# Patient Record
Sex: Female | Born: 1950
Health system: Southern US, Community
[De-identification: ages and names within clinical notes are randomized; demographics above are authoritative.]

## PROBLEM LIST (undated history)

## (undated) DIAGNOSIS — N390 Urinary tract infection, site not specified: Secondary | ICD-10-CM

## (undated) DIAGNOSIS — J3489 Other specified disorders of nose and nasal sinuses: Secondary | ICD-10-CM

## (undated) DIAGNOSIS — Z87898 Personal history of other specified conditions: Secondary | ICD-10-CM

## (undated) DIAGNOSIS — Z9109 Other allergy status, other than to drugs and biological substances: Secondary | ICD-10-CM

## (undated) DIAGNOSIS — Z8619 Personal history of other infectious and parasitic diseases: Secondary | ICD-10-CM

## (undated) DIAGNOSIS — U071 COVID-19: Secondary | ICD-10-CM

## (undated) DIAGNOSIS — D259 Leiomyoma of uterus, unspecified: Secondary | ICD-10-CM

## (undated) HISTORY — DX: COVID-19: U07.1

## (undated) HISTORY — DX: Personal history of other infectious and parasitic diseases: Z86.19

## (undated) HISTORY — DX: Other specified disorders of nose and nasal sinuses: J34.89

## (undated) HISTORY — DX: Personal history of other specified conditions: Z87.898

## (undated) HISTORY — DX: Leiomyoma of uterus, unspecified: D25.9

## (undated) HISTORY — DX: Other allergy status, other than to drugs and biological substances: Z91.09

## (undated) HISTORY — DX: Urinary tract infection, site not specified: N39.0

## (undated) HISTORY — PX: OTHER SURGICAL HISTORY: SHX169

---

## 2004-07-22 ENCOUNTER — Ambulatory Visit: Payer: Self-pay | Admitting: Internal Medicine

## 2005-11-22 ENCOUNTER — Ambulatory Visit: Payer: Self-pay | Admitting: Internal Medicine

## 2006-10-30 ENCOUNTER — Ambulatory Visit: Payer: Self-pay | Admitting: Internal Medicine

## 2006-12-13 ENCOUNTER — Ambulatory Visit: Payer: Self-pay | Admitting: Internal Medicine

## 2007-02-13 ENCOUNTER — Ambulatory Visit: Payer: Self-pay | Admitting: Internal Medicine

## 2007-12-17 ENCOUNTER — Ambulatory Visit: Payer: Self-pay | Admitting: Internal Medicine

## 2008-01-30 ENCOUNTER — Ambulatory Visit: Payer: Self-pay | Admitting: Urology

## 2008-12-21 ENCOUNTER — Ambulatory Visit: Payer: Self-pay | Admitting: Internal Medicine

## 2009-12-22 ENCOUNTER — Ambulatory Visit: Payer: Self-pay | Admitting: Internal Medicine

## 2010-11-03 ENCOUNTER — Ambulatory Visit: Payer: Self-pay | Admitting: Internal Medicine

## 2010-12-26 ENCOUNTER — Ambulatory Visit: Payer: Self-pay | Admitting: Internal Medicine

## 2011-12-27 ENCOUNTER — Ambulatory Visit: Payer: Self-pay | Admitting: Internal Medicine

## 2012-07-26 LAB — HM PAP SMEAR: HM Pap smear: NEGATIVE

## 2012-11-04 ENCOUNTER — Encounter: Payer: Self-pay | Admitting: Internal Medicine

## 2012-12-26 ENCOUNTER — Encounter: Payer: Self-pay | Admitting: Internal Medicine

## 2012-12-26 ENCOUNTER — Ambulatory Visit (INDEPENDENT_AMBULATORY_CARE_PROVIDER_SITE_OTHER): Payer: BC Managed Care – PPO | Admitting: Internal Medicine

## 2012-12-26 VITALS — BP 110/70 | HR 74 | Temp 98.4°F | Ht 67.5 in | Wt 114.5 lb

## 2012-12-26 DIAGNOSIS — Z23 Encounter for immunization: Secondary | ICD-10-CM

## 2012-12-26 DIAGNOSIS — H9319 Tinnitus, unspecified ear: Secondary | ICD-10-CM

## 2012-12-26 DIAGNOSIS — R634 Abnormal weight loss: Secondary | ICD-10-CM

## 2012-12-26 DIAGNOSIS — Z87898 Personal history of other specified conditions: Secondary | ICD-10-CM

## 2012-12-26 DIAGNOSIS — Z13220 Encounter for screening for lipoid disorders: Secondary | ICD-10-CM

## 2012-12-26 DIAGNOSIS — Z8742 Personal history of other diseases of the female genital tract: Secondary | ICD-10-CM

## 2012-12-26 DIAGNOSIS — Z9109 Other allergy status, other than to drugs and biological substances: Secondary | ICD-10-CM

## 2012-12-26 DIAGNOSIS — M810 Age-related osteoporosis without current pathological fracture: Secondary | ICD-10-CM

## 2012-12-26 DIAGNOSIS — R319 Hematuria, unspecified: Secondary | ICD-10-CM

## 2012-12-26 DIAGNOSIS — C4491 Basal cell carcinoma of skin, unspecified: Secondary | ICD-10-CM

## 2012-12-26 DIAGNOSIS — R0989 Other specified symptoms and signs involving the circulatory and respiratory systems: Secondary | ICD-10-CM

## 2012-12-26 DIAGNOSIS — D649 Anemia, unspecified: Secondary | ICD-10-CM

## 2012-12-26 LAB — CBC WITH DIFFERENTIAL/PLATELET
Basophils Absolute: 0.1 10*3/uL (ref 0.0–0.1)
Eosinophils Absolute: 0.1 10*3/uL (ref 0.0–0.7)
Lymphocytes Relative: 17.8 % (ref 12.0–46.0)
MCHC: 33.6 g/dL (ref 30.0–36.0)
Monocytes Relative: 5.5 % (ref 3.0–12.0)
Neutrophils Relative %: 75.1 % (ref 43.0–77.0)
Platelets: 242 10*3/uL (ref 150.0–400.0)
RDW: 12.9 % (ref 11.5–14.6)

## 2012-12-26 NOTE — Progress Notes (Signed)
  Subjective:    Patient ID: Hannah Hart, female    DOB: Jun 13, 1950, 62 y.o.   MRN: [id number]  HPI 62 year old female with past history of allergy problems who comes in today to follow up on this as well as to establish care.  She states she has been doing well.  Her brother recently had a stroke.  She has been going back and forth to the hospital.  Not eating regularly.  Has lost some weight, but she relates it to this.  States he is doing better.  She is eating and back in to her routine now.  Stays active.  No cardiac symptoms with increased activity or exertion.  Breathing stable.  No acid reflux.  No nausea or vomiting.  Bowels stable.     Past Medical History  Diagnosis Date  . History of chicken pox   . History of abnormal Pap smear   . Environmental allergies   . Uterine fibroid     Outpatient Encounter Prescriptions as of 12/26/2012  Medication Sig Dispense Refill  . fexofenadine (ALLEGRA) 180 MG tablet Take 180 mg by mouth as needed.      . fluticasone (FLONASE) 50 MCG/ACT nasal spray Place 2 sprays into the nose as needed for rhinitis.      Marland Kitchen ibuprofen (ADVIL,MOTRIN) 200 MG tablet Take 200 mg by mouth every 6 (six) hours as needed for pain.       No facility-administered encounter medications on file as of 12/26/2012.    Review of Systems Patient denies any headache, lightheadedness or dizziness.  No sinus or allergy symptoms.  No chest pain, tightness or palpitations.  No increased shortness of breath, cough or congestion.  No nausea or vomiting.  No acid reflux.  No abdominal pain or cramping.  No bowel change, such as diarrhea, constipation, BRBPR or melana.  No urine change.   Overall feels good.  Still seeing Dr Milas Gain for her gyn exams.  Up to date.  Saw him in 3/14.  Due mammogram.      Objective:   Physical Exam Filed Vitals:   12/26/12 0806  BP: 110/70  Pulse: 74  Temp: 98.4 F (64.2 C)   62 year old female in no acute distress.   HEENT:  Nares- clear.   Oropharynx - without lesions. NECK:  Supple.  Nontender.  No audible bruit.  HEART:  Appears to be regular. LUNGS:  No crackles or wheezing audible.  Respirations even and unlabored.  RADIAL PULSE:  Equal bilaterally.    BREASTS: performed by gyn.  ABDOMEN:  Soft, nontender.  Bowel sounds present and normal.  No audible abdominal bruit.  GU: performed by gyn.  EXTREMITIES:  No increased edema present.  DP pulses palpable and equal bilaterally.          Assessment & Plan:  HEALTH MAINTENANCE.  Sees gyn for her breast, pelvic and pap smears.  Up to date.  Saw Dr Milas Gain in 3/14.  Last colonoscopy 2006.  Due f/u in 2016.  IFOB.  Check cholesterol.  Schedule mammogram.  Last mammogram 12/27/11.  Tdap given today.

## 2012-12-27 LAB — COMPREHENSIVE METABOLIC PANEL
ALT: 22 U/L (ref 0–35)
AST: 31 U/L (ref 0–37)
CO2: 28 mEq/L (ref 19–32)
Calcium: 9.7 mg/dL (ref 8.4–10.5)
Chloride: 106 mEq/L (ref 96–112)
GFR: 106.42 mL/min (ref >60.00)
Sodium: 138 mEq/L (ref 135–145)
Total Protein: 7.4 g/dL (ref 6.0–8.3)

## 2012-12-27 LAB — LIPID PANEL
HDL: 109.5 mg/dL (ref >39.00)
Total CHOL/HDL Ratio: 2
Triglycerides: 41 mg/dL (ref 0.0–149.0)

## 2012-12-27 LAB — LDL CHOLESTEROL, DIRECT: Direct LDL: 99.2 mg/dL

## 2012-12-28 ENCOUNTER — Telehealth: Payer: Self-pay | Admitting: Internal Medicine

## 2012-12-28 NOTE — Telephone Encounter (Signed)
Pt notified of her lab results.  Wants a copy sent to her.  Also needs IFOB.  Anyway to send to her.  Thanks.

## 2012-12-30 ENCOUNTER — Encounter: Payer: Self-pay | Admitting: Internal Medicine

## 2012-12-30 DIAGNOSIS — H9319 Tinnitus, unspecified ear: Secondary | ICD-10-CM | POA: Insufficient documentation

## 2012-12-30 DIAGNOSIS — R87619 Unspecified abnormal cytological findings in specimens from cervix uteri: Secondary | ICD-10-CM | POA: Insufficient documentation

## 2012-12-30 DIAGNOSIS — M810 Age-related osteoporosis without current pathological fracture: Secondary | ICD-10-CM | POA: Insufficient documentation

## 2012-12-30 DIAGNOSIS — Z862 Personal history of diseases of the blood and blood-forming organs and certain disorders involving the immune mechanism: Secondary | ICD-10-CM | POA: Insufficient documentation

## 2012-12-30 DIAGNOSIS — R0989 Other specified symptoms and signs involving the circulatory and respiratory systems: Secondary | ICD-10-CM | POA: Insufficient documentation

## 2012-12-30 DIAGNOSIS — R634 Abnormal weight loss: Secondary | ICD-10-CM | POA: Insufficient documentation

## 2012-12-30 DIAGNOSIS — D649 Anemia, unspecified: Secondary | ICD-10-CM | POA: Insufficient documentation

## 2012-12-30 DIAGNOSIS — R319 Hematuria, unspecified: Secondary | ICD-10-CM | POA: Insufficient documentation

## 2012-12-30 DIAGNOSIS — C4491 Basal cell carcinoma of skin, unspecified: Secondary | ICD-10-CM | POA: Insufficient documentation

## 2012-12-30 DIAGNOSIS — Z9109 Other allergy status, other than to drugs and biological substances: Secondary | ICD-10-CM | POA: Insufficient documentation

## 2012-12-30 NOTE — Assessment & Plan Note (Signed)
Saw ENT.  Worked up.  No change in hearing.

## 2012-12-30 NOTE — Assessment & Plan Note (Signed)
Worked up by Dr Lonna Cobb.  Per pt, ultrasound and urine cytology negative.  Follow urine.

## 2012-12-30 NOTE — Assessment & Plan Note (Signed)
Bone density revealed T score -2.9 in the lumbar spine and -1.9 in the femoral neck.  She has wanted to hold off on any further medication for her bones.  Continue calcium and vitamin D.  Follow.

## 2012-12-30 NOTE — Assessment & Plan Note (Signed)
She is eating and drinking well now.  Feels her recent weight loss is related to her brother's medical issues.  Feels she is back in her routine now.  She will follow her weight.  Call me with weight.

## 2012-12-30 NOTE — Assessment & Plan Note (Signed)
Last hgb wnl.  Follow cbc.  

## 2012-12-30 NOTE — Assessment & Plan Note (Signed)
Sees Dr Milas Gain.  Up to date with pap smears.  Last check wnl.

## 2012-12-30 NOTE — Assessment & Plan Note (Signed)
Previous carotid ultrasound 7/12 - no hemodynamically significant stenosis.  Follow.   

## 2012-12-30 NOTE — Assessment & Plan Note (Signed)
Previously had a basal cell removed from her foot.  Followed by dermatology at [facility].    

## 2012-12-30 NOTE — Assessment & Plan Note (Signed)
Stable.  On allegra.   

## 2013-01-01 NOTE — Telephone Encounter (Signed)
Mailed copy of labs & ifob

## 2013-01-07 ENCOUNTER — Encounter: Payer: Self-pay | Admitting: Internal Medicine

## 2013-01-08 ENCOUNTER — Ambulatory Visit: Payer: Self-pay | Admitting: Internal Medicine

## 2013-01-13 ENCOUNTER — Telehealth: Payer: Self-pay | Admitting: Internal Medicine

## 2013-01-13 NOTE — Telephone Encounter (Signed)
Pt is wanting call back concerning the results she received about her Mammo. Please call her back as soon as possible she is extremely worried and she got the letter on Friday and was not able to talk to any one about it. Pt would like to speak with Dr. Lorin Picket herself.

## 2013-01-13 NOTE — Telephone Encounter (Signed)
Pt received letter from imaging center with results (Looks like the wrong results were scanned in her chart for 12/25/12, I have called & requested her results again)

## 2013-01-13 NOTE — Telephone Encounter (Signed)
Called pt and discussed mammo results.  She just had questions about her 01/08/13 mammogram.  Is scheduled to have follow up views tomorrow.

## 2013-01-14 ENCOUNTER — Ambulatory Visit: Payer: Self-pay | Admitting: Internal Medicine

## 2013-01-30 ENCOUNTER — Encounter: Payer: Self-pay | Admitting: Internal Medicine

## 2013-02-03 ENCOUNTER — Encounter: Payer: Self-pay | Admitting: Internal Medicine

## 2013-07-18 ENCOUNTER — Ambulatory Visit: Payer: BC Managed Care – PPO | Admitting: Internal Medicine

## 2013-07-18 ENCOUNTER — Telehealth: Payer: Self-pay | Admitting: Internal Medicine

## 2013-07-18 NOTE — Telephone Encounter (Signed)
See if she can come in at 2:30 today.  Work in for this.  Block 30 minute slot.  Thanks.

## 2013-07-18 NOTE — Telephone Encounter (Signed)
Pt called and apologized for missing appt, states she did not get the message.  Advised her appt is still scheduled 3/26.  Pt states if she is able to be worked in sooner next week she would be grateful.

## 2013-07-18 NOTE — Telephone Encounter (Signed)
Please put pt on the schedule for Monday 07/21/13 at 11:00 for breast exam.  Thanks.

## 2013-07-18 NOTE — Telephone Encounter (Signed)
Pt has found a pea-size lump on her right breast.  Appt scheduled 3/26 at 8:15 a.m.  Pt asking if Dr. Nicki Reaper could possibly see her sooner.  Pt prefers to see Dr. Nicki Reaper only.  May leave msg on cell to let her know if earlier appt possible.  States she will take any date/time we can offer.

## 2013-07-18 NOTE — Telephone Encounter (Signed)
Left msg for pt informing of appt today at 2:30 to see Dr. Nicki Reaper.  Asked pt to call to confirm appt.  Previously scheduled appt on 3/26 has not yet been cancelled until pt confirms appt for 3/20.

## 2013-07-21 ENCOUNTER — Encounter (INDEPENDENT_AMBULATORY_CARE_PROVIDER_SITE_OTHER): Payer: Self-pay

## 2013-07-21 ENCOUNTER — Encounter: Payer: Self-pay | Admitting: Internal Medicine

## 2013-07-21 ENCOUNTER — Ambulatory Visit (INDEPENDENT_AMBULATORY_CARE_PROVIDER_SITE_OTHER): Payer: BC Managed Care – PPO | Admitting: Internal Medicine

## 2013-07-21 VITALS — BP 122/62 | HR 87 | Temp 98.2°F | Ht 67.5 in | Wt 117.5 lb

## 2013-07-21 DIAGNOSIS — R634 Abnormal weight loss: Secondary | ICD-10-CM

## 2013-07-21 DIAGNOSIS — R928 Other abnormal and inconclusive findings on diagnostic imaging of breast: Secondary | ICD-10-CM

## 2013-07-21 DIAGNOSIS — N63 Unspecified lump in unspecified breast: Secondary | ICD-10-CM

## 2013-07-21 NOTE — Telephone Encounter (Signed)
Pt aware of appontment date and time °

## 2013-07-21 NOTE — Progress Notes (Signed)
Pre-visit discussion using our clinic review tool. No additional management support is needed unless otherwise documented below in the visit note.  

## 2013-07-21 NOTE — Progress Notes (Signed)
  Subjective:    Patient ID: Hannah Hart, female    DOB: [birth year], 63 y.o.   MRN: [id number]  HPI 63 year old female with past history of allergy problems who comes in today as a work in with concerns regarding a "lump" in her right breast.   She states she has been doing well.  Approximately five days ago, she noticed a lump in her right breast.  No pain.  No nipple discharge.  Persistent.  She has subsequently noticed a smaller lump in her left breast.   Non tender.     Past Medical History  Diagnosis Date  . History of chicken pox   . History of abnormal Pap smear   . Environmental allergies   . Uterine fibroid     Outpatient Encounter Prescriptions as of 07/21/2013  Medication Sig  . fexofenadine (ALLEGRA) 180 MG tablet Take 180 mg by mouth as needed.  . fluticasone (FLONASE) 50 MCG/ACT nasal spray Place 2 sprays into the nose as needed for rhinitis.  Marland Kitchen ibuprofen (ADVIL,MOTRIN) 200 MG tablet Take 200 mg by mouth every 6 (six) hours as needed for pain.    Review of Systems Breast lump found in her right and left breast.  No pain.  No nipple discharge or nipple retraction present.  No other lumps or nodes preset.  Overall she feels she is doing well.       Objective:   Physical Exam  Filed Vitals:   07/21/13 1046  BP: 122/62  Pulse: 87  Temp: 98.2 F (66.47 C)   63 year old female in no acute distress.  NECK:  Supple.  Nontender.   HEART:  Appears to be regular. LUNGS:  No crackles or wheezing audible.  Respirations even and unlabored.   BREASTS:  Breast nodule (small mobile superficial) - right breast (9:00).  Non tender.  Small palpable lump - left breast - 3:00.    ABDOMEN:  Soft, nontender.  Bowel sounds present and normal.        Assessment & Plan:  HEALTH MAINTENANCE.  Sees gyn for her breast, pelvic and pap smears.  Up to date.  Saw Dr Doree Fudge in 3/14.  Last colonoscopy 2006.  Due f/u in 2016.  Last mammogram 01/13/13 - recommended f/u views - left breast.  F/u  views left breast - 01/13/14 - Birads I.

## 2013-07-22 ENCOUNTER — Ambulatory Visit: Payer: Self-pay | Admitting: Internal Medicine

## 2013-07-22 LAB — HM MAMMOGRAPHY

## 2013-07-23 ENCOUNTER — Encounter: Payer: Self-pay | Admitting: Internal Medicine

## 2013-07-24 ENCOUNTER — Ambulatory Visit: Payer: BC Managed Care – PPO | Admitting: Internal Medicine

## 2013-07-26 ENCOUNTER — Encounter: Payer: Self-pay | Admitting: Internal Medicine

## 2013-07-26 DIAGNOSIS — N63 Unspecified lump in unspecified breast: Secondary | ICD-10-CM | POA: Insufficient documentation

## 2013-07-26 NOTE — Assessment & Plan Note (Signed)
Weight up since last visit.  Follow.

## 2013-07-26 NOTE — Assessment & Plan Note (Signed)
Left and right breast nodule as outlined.  Will schedule her for a diagnostic mammogram (right and left) with ultrasound.  Further  w/up pending.

## 2013-08-06 ENCOUNTER — Encounter: Payer: Self-pay | Admitting: *Deleted

## 2013-09-30 ENCOUNTER — Encounter: Payer: Self-pay | Admitting: Internal Medicine

## 2013-12-29 ENCOUNTER — Ambulatory Visit (INDEPENDENT_AMBULATORY_CARE_PROVIDER_SITE_OTHER): Payer: BC Managed Care – PPO | Admitting: Internal Medicine

## 2013-12-29 ENCOUNTER — Encounter: Payer: Self-pay | Admitting: Internal Medicine

## 2013-12-29 VITALS — BP 138/70 | HR 77 | Temp 98.2°F | Ht 67.75 in | Wt 117.8 lb

## 2013-12-29 DIAGNOSIS — R319 Hematuria, unspecified: Secondary | ICD-10-CM

## 2013-12-29 DIAGNOSIS — D649 Anemia, unspecified: Secondary | ICD-10-CM

## 2013-12-29 DIAGNOSIS — N63 Unspecified lump in unspecified breast: Secondary | ICD-10-CM

## 2013-12-29 DIAGNOSIS — R928 Other abnormal and inconclusive findings on diagnostic imaging of breast: Secondary | ICD-10-CM

## 2013-12-29 DIAGNOSIS — Z13220 Encounter for screening for lipoid disorders: Secondary | ICD-10-CM

## 2013-12-29 DIAGNOSIS — M810 Age-related osteoporosis without current pathological fracture: Secondary | ICD-10-CM

## 2013-12-29 DIAGNOSIS — Z9109 Other allergy status, other than to drugs and biological substances: Secondary | ICD-10-CM

## 2013-12-29 DIAGNOSIS — R634 Abnormal weight loss: Secondary | ICD-10-CM

## 2013-12-29 DIAGNOSIS — C4491 Basal cell carcinoma of skin, unspecified: Secondary | ICD-10-CM

## 2013-12-29 DIAGNOSIS — R0989 Other specified symptoms and signs involving the circulatory and respiratory systems: Secondary | ICD-10-CM

## 2013-12-29 DIAGNOSIS — Z1211 Encounter for screening for malignant neoplasm of colon: Secondary | ICD-10-CM

## 2013-12-29 LAB — COMPREHENSIVE METABOLIC PANEL
ALT: 16 U/L (ref 0–35)
AST: 22 U/L (ref 0–37)
Albumin: 4.2 g/dL (ref 3.5–5.2)
Alkaline Phosphatase: 75 U/L (ref 39–117)
BILIRUBIN TOTAL: 0.7 mg/dL (ref 0.2–1.2)
BUN: 13 mg/dL (ref 6–23)
CO2: 31 mEq/L (ref 19–32)
CREATININE: 0.7 mg/dL (ref 0.4–1.2)
Calcium: 10 mg/dL (ref 8.4–10.5)
Chloride: 102 mEq/L (ref 96–112)
GFR: 90.48 mL/min (ref >60.00)
Glucose, Bld: 91 mg/dL (ref 70–99)
Potassium: 4.5 mEq/L (ref 3.5–5.1)
Sodium: 139 mEq/L (ref 135–145)
Total Protein: 7.4 g/dL (ref 6.0–8.3)

## 2013-12-29 LAB — CBC WITH DIFFERENTIAL/PLATELET
Basophils Absolute: 0.1 10*3/uL (ref 0.0–0.1)
Basophils Relative: 1.1 % (ref 0.0–3.0)
EOS ABS: 0.1 10*3/uL (ref 0.0–0.7)
Eosinophils Relative: 1.6 % (ref 0.0–5.0)
HEMATOCRIT: 40.9 % (ref 36.0–46.0)
HEMOGLOBIN: 13.6 g/dL (ref 12.0–15.0)
LYMPHS ABS: 1.8 10*3/uL (ref 0.7–4.0)
Lymphocytes Relative: 26.1 % (ref 12.0–46.0)
MCHC: 33.3 g/dL (ref 30.0–36.0)
MCV: 94.2 fl (ref 78.0–100.0)
Monocytes Absolute: 0.4 10*3/uL (ref 0.1–1.0)
Monocytes Relative: 6.4 % (ref 3.0–12.0)
NEUTROS ABS: 4.5 10*3/uL (ref 1.4–7.7)
Neutrophils Relative %: 64.8 % (ref 43.0–77.0)
Platelets: 255 10*3/uL (ref 150.0–400.0)
RBC: 4.34 Mil/uL (ref 3.87–5.11)
RDW: 13.3 % (ref 11.5–15.5)
WBC: 6.9 10*3/uL (ref 4.0–10.5)

## 2013-12-29 LAB — LIPID PANEL
CHOLESTEROL: 227 mg/dL — AB (ref 0–200)
HDL: 100.2 mg/dL (ref >39.00)
LDL CALC: 108 mg/dL — AB (ref 0–99)
NonHDL: 126.8
TRIGLYCERIDES: 93 mg/dL (ref 0.0–149.0)
Total CHOL/HDL Ratio: 2
VLDL: 18.6 mg/dL (ref 0.0–40.0)

## 2013-12-29 LAB — VITAMIN D 25 HYDROXY (VIT D DEFICIENCY, FRACTURES): VITD: 33.27 ng/mL (ref 30.00–100.00)

## 2013-12-29 LAB — TSH: TSH: 1.95 u[IU]/mL (ref 0.35–4.50)

## 2013-12-29 NOTE — Progress Notes (Signed)
Pre visit review using our clinic review tool, if applicable. No additional management support is needed unless otherwise documented below in the visit note. 

## 2013-12-29 NOTE — Progress Notes (Signed)
  Subjective:    Patient ID: Hannah Hart, female    DOB: 1951-01-13, 63 y.o.   MRN: [id number]  HPI 63 year old female with past history of allergy problems who comes in today for her physical exam.   States she is doing well.  Still with some increased stress with her brother's health issues.  She feels she is handling things well.  Physically feels good.  Stays active.  No cardiac symptoms with increased activity or exertion.  Due to see Dr Doree Fudge next month.  Had her mammogram in March 2015.  Diagnostic.  Has not noticed any changes in her breasts.  Weight is stable.  States she eats well.     Past Medical History  Diagnosis Date  . History of chicken pox   . History of abnormal Pap smear   . Environmental allergies   . Uterine fibroid     Outpatient Encounter Prescriptions as of 12/29/2013  Medication Sig  . fexofenadine (ALLEGRA) 180 MG tablet Take 180 mg by mouth as needed.  . fluticasone (FLONASE) 50 MCG/ACT nasal spray Place 2 sprays into the nose as needed for rhinitis.  Marland Kitchen ibuprofen (ADVIL,MOTRIN) 200 MG tablet Take 200 mg by mouth every 6 (six) hours as needed for pain.    Review of Systems Patient denies any headache, lightheadedness or dizziness.  No significant sinus or allergy symptoms.  Takes claritin prn. This works for her.  No chest pain, tightness or palpitations.  No increased shortness of breath, cough or congestion.  No nausea or vomiting.  No acid reflux.  No abdominal pain or cramping.  No bowel change, such as diarrhea, constipation, BRBPR or melana.  No urine change.  Stays active.  Handling stress well.  Her brother is doing better.  No change in her breasts.          Objective:   Physical Exam  Filed Vitals:   12/29/13 0828  BP: 138/70  Pulse: 77  Temp: 98.2 F (36.8 C)   Blood pressure recheck:  130/72, pulse 34  63 year old female in no acute distress.   HEENT:  Nares- clear.  Oropharynx - without lesions. NECK:  Supple.  Nontender.  No audible  bruit.  HEART:  Appears to be regular. LUNGS:  No crackles or wheezing audible.  Respirations even and unlabored.  RADIAL PULSE:  Equal bilaterally.    BREASTS:  No nipple discharge or nipple retraction present.  Could not appreciate any distinct nodules or axillary adenopathy.  ABDOMEN:  Soft, nontender.  Bowel sounds present and normal.  No audible abdominal bruit.  GU:  peformed by gyn.     EXTREMITIES:  No increased edema present.  DP pulses palpable and equal bilaterally.          Assessment & Plan:  HEALTH MAINTENANCE.  Sees gyn for her breast, pelvic and pap smears.  Up to date.  Due to see Dr Doree Fudge next month.  Last colonoscopy 2006.  Due f/u in 2016.  IFOB given.  Mammogram 01/13/13 - recommended f/u views - left breast.  F/u views left breast - 01/13/13 - Birads I.  Had f/u diagnostic bilateral mammogram 07/22/13 - Birads II.  Recommended screening mammogram in 1 year.

## 2013-12-30 ENCOUNTER — Encounter: Payer: Self-pay | Admitting: *Deleted

## 2013-12-30 ENCOUNTER — Encounter: Payer: Self-pay | Admitting: Internal Medicine

## 2013-12-30 NOTE — Assessment & Plan Note (Signed)
Controls with prn otc antihistamine.

## 2013-12-30 NOTE — Assessment & Plan Note (Signed)
Weight stable from last check.  Follow.  States she is eating well.

## 2013-12-30 NOTE — Assessment & Plan Note (Signed)
Previous carotid ultrasound 7/12 - no hemodynamically significant stenosis.  Follow.

## 2013-12-30 NOTE — Assessment & Plan Note (Signed)
Sees Dr Doree Fudge.  Up to date with pap smears.  Last check wnl.  Due to f/u with Dr Doree Fudge next month.

## 2013-12-30 NOTE — Assessment & Plan Note (Signed)
Previously had a basal cell removed from her foot.  Followed by dermatology at [facility].

## 2013-12-30 NOTE — Assessment & Plan Note (Signed)
Bone density revealed T score -2.9 in the lumbar spine and -1.9 in the femoral neck.  She has wanted to hold off on any further medication for her bones.  Continue calcium and vitamin D and weight bearing exercise.  Follow.  Vitamin D level just checked 12/29/13 - wnl.  Schedule her for a f/u bone density.

## 2013-12-30 NOTE — Assessment & Plan Note (Signed)
Worked up by Dr Bernardo Heater.  Per pt, ultrasound and urine cytology negative.  Urinalysis 11/03/11 - no red blood cells.

## 2013-12-30 NOTE — Assessment & Plan Note (Signed)
Had diagnostic mammogram with ultrasound of both breasts - Birads II.  Recommended f/u screening mammogram in one year.  Due 06/2014.

## 2013-12-30 NOTE — Assessment & Plan Note (Signed)
Last hgb wnl.  Follow cbc.  

## 2014-01-08 LAB — HM DEXA SCAN

## 2014-01-12 ENCOUNTER — Encounter: Payer: Self-pay | Admitting: Internal Medicine

## 2014-01-19 ENCOUNTER — Other Ambulatory Visit (INDEPENDENT_AMBULATORY_CARE_PROVIDER_SITE_OTHER): Payer: BC Managed Care – PPO

## 2014-01-19 DIAGNOSIS — Z1211 Encounter for screening for malignant neoplasm of colon: Secondary | ICD-10-CM

## 2014-01-19 LAB — FECAL OCCULT BLOOD, IMMUNOCHEMICAL: Fecal Occult Bld: NEGATIVE

## 2014-01-20 ENCOUNTER — Telehealth: Payer: Self-pay | Admitting: Internal Medicine

## 2014-01-20 ENCOUNTER — Encounter: Payer: Self-pay | Admitting: *Deleted

## 2014-01-20 NOTE — Telephone Encounter (Signed)
Notify pt that her bone density reveals osteoporosis.  Bone density has decreased more since last check.  Please schedule her an appt to discuss treatment options.  (no emergency)

## 2014-01-20 NOTE — Telephone Encounter (Signed)
Called and advised patient of results,  verbalized understanding. Appt sch 02/24/14 to discuss

## 2014-01-26 ENCOUNTER — Encounter: Payer: Self-pay | Admitting: Internal Medicine

## 2014-02-24 ENCOUNTER — Ambulatory Visit (INDEPENDENT_AMBULATORY_CARE_PROVIDER_SITE_OTHER): Payer: BC Managed Care – PPO | Admitting: Internal Medicine

## 2014-02-24 ENCOUNTER — Encounter: Payer: Self-pay | Admitting: Internal Medicine

## 2014-02-24 VITALS — BP 122/60 | HR 91 | Temp 98.0°F | Ht 67.75 in | Wt 119.0 lb

## 2014-02-24 DIAGNOSIS — M810 Age-related osteoporosis without current pathological fracture: Secondary | ICD-10-CM

## 2014-02-24 NOTE — Progress Notes (Signed)
Pre visit review using our clinic review tool, if applicable. No additional management support is needed unless otherwise documented below in the visit note. 

## 2014-03-01 ENCOUNTER — Encounter: Payer: Self-pay | Admitting: Internal Medicine

## 2014-03-01 NOTE — Progress Notes (Signed)
  Subjective:    Patient ID: Hannah Hart, female    DOB: 25-May-1950, 63 y.o.   MRN: [id number]  HPI 63 year old female with past history of allergy problems who comes in today to discuss recent bone density results.  States she is doing well.  Recent bone density revealed osteoporosis - interval decrease from 11/2009.  We discussed treatment options, including nasal sprays, estrogen, bisphosphonates, etc.  Discussed need for weight bearing exercise and vitamin D supplementation.     Past Medical History  Diagnosis Date  . History of chicken pox   . History of abnormal Pap smear   . Environmental allergies   . Uterine fibroid     Outpatient Encounter Prescriptions as of 02/24/2014  Medication Sig  . fexofenadine (ALLEGRA) 180 MG tablet Take 180 mg by mouth as needed.  . fluticasone (FLONASE) 50 MCG/ACT nasal spray Place 2 sprays into the nose as needed for rhinitis.  Marland Kitchen ibuprofen (ADVIL,MOTRIN) 200 MG tablet Take 200 mg by mouth every 6 (six) hours as needed for pain.    Review of Systems  No nausea or vomiting.  No acid reflux.  Discussed recent bone density results.  Discussed treatment options.  Questions answered.         Objective:   Physical Exam  Filed Vitals:   02/24/14 1138  BP: 122/60  Pulse: 91  Temp: 98 F (60.56 C)   63 year old female in no acute distress.  HEART:  Appears to be regular. LUNGS:  No crackles or wheezing audible.  Respirations even and unlabored.         Assessment & Plan:  Osteoporosis Discussed bone density results.  Discussed treatment options.  She declines prescription medication.  Want to work on exercise and take calcium and vitamin D.    HEALTH MAINTENANCE.  Sees gyn for her breast, pelvic and pap smears.  Up to date.  Last colonoscopy 2006.  Due f/u in 2016.  IFOB given.  Mammogram 01/13/13 - recommended f/u views - left breast.  F/u views left breast - 01/13/13 - Birads I.  Had f/u diagnostic bilateral mammogram 07/22/13 - Birads II.   Recommended screening mammogram in 1 year.    I spent 15 minutes with the patient and more than 50% of the time was spent in consultation regarding the above.

## 2014-09-01 ENCOUNTER — Ambulatory Visit (INDEPENDENT_AMBULATORY_CARE_PROVIDER_SITE_OTHER): Payer: BLUE CROSS/BLUE SHIELD | Admitting: Nurse Practitioner

## 2014-09-01 ENCOUNTER — Encounter: Payer: Self-pay | Admitting: Nurse Practitioner

## 2014-09-01 VITALS — BP 112/60 | HR 91 | Temp 98.1°F | Resp 12 | Ht 67.75 in | Wt 119.8 lb

## 2014-09-01 DIAGNOSIS — R21 Rash and other nonspecific skin eruption: Secondary | ICD-10-CM | POA: Diagnosis not present

## 2014-09-01 NOTE — Patient Instructions (Signed)
Tick Bite Information Ticks are insects that attach themselves to the skin and draw blood for food. There are various types of ticks. Common types include wood ticks and deer ticks. Most ticks live in shrubs and grassy areas. Ticks can climb onto your body when you make contact with leaves or grass where the tick is waiting. The most common places on the body for ticks to attach themselves are the scalp, neck, armpits, waist, and groin. Most tick bites are harmless, but sometimes ticks carry germs that cause diseases. These germs can be spread to a person during the tick's feeding process. The chance of a disease spreading through a tick bite depends on:   The type of tick.  Time of year.   How long the tick is attached.   Geographic location.  HOW CAN YOU PREVENT TICK BITES? Take these steps to help prevent tick bites when you are outdoors:  Wear protective clothing. Long sleeves and long pants are best.   Wear white clothes so you can see ticks more easily.  Tuck your pant legs into your socks.   If walking on a trail, stay in the middle of the trail to avoid brushing against bushes.  Avoid walking through areas with long grass.  Put insect repellent on all exposed skin and along boot tops, pant legs, and sleeve cuffs.   Check clothing, hair, and skin repeatedly and before going inside.   Brush off any ticks that are not attached.  Take a shower or bath as soon as possible after being outdoors.  WHAT IS THE PROPER WAY TO REMOVE A TICK? Ticks should be removed as soon as possible to help prevent diseases caused by tick bites. 1. If latex gloves are available, put them on before trying to remove a tick.  2. Using fine-point tweezers, grasp the tick as close to the skin as possible. You may also use curved forceps or a tick removal tool. Grasp the tick as close to its head as possible. Avoid grasping the tick on its body. 3. Pull gently with steady upward pressure until  the tick lets go. Do not twist the tick or jerk it suddenly. This may break off the tick's head or mouth parts. 4. Do not squeeze or crush the tick's body. This could force disease-carrying fluids from the tick into your body.  5. After the tick is removed, wash the bite area and your hands with soap and water or other disinfectant such as alcohol. 6. Apply a small amount of antiseptic cream or ointment to the bite site.  7. Wash and disinfect any instruments that were used.  Do not try to remove a tick by applying a hot match, petroleum jelly, or fingernail polish to the tick. These methods do not work and may increase the chances of disease being spread from the tick bite.  WHEN SHOULD YOU SEEK MEDICAL CARE? Contact your health care provider if you are unable to remove a tick from your skin or if a part of the tick breaks off and is stuck in the skin.  After a tick bite, you need to be aware of signs and symptoms that could be related to diseases spread by ticks. Contact your health care provider if you develop any of the following in the days or weeks after the tick bite:  Unexplained fever.  Rash. A circular rash that appears days or weeks after the tick bite may indicate the possibility of Lyme disease. The rash may resemble   a target with a bull's-eye and may occur at a different part of your body than the tick bite.  Redness and swelling in the area of the tick bite.   Tender, swollen lymph glands.   Diarrhea.   Weight loss.   Cough.   Fatigue.   Muscle, joint, or bone pain.   Abdominal pain.   Headache.   Lethargy or a change in your level of consciousness.  Difficulty walking or moving your legs.   Numbness in the legs.   Paralysis.  Shortness of breath.   Confusion.   Repeated vomiting.  Document Released: 04/14/2000 Document Revised: 02/05/2013 Document Reviewed: 09/25/2012 Exit[employer] Patient Information 2015 [employer]. This information is  not intended to replace advice given to you by your health care provider. Make sure you discuss any questions you have with your health care provider.  

## 2014-09-01 NOTE — Progress Notes (Signed)
Pre visit review using our clinic review tool, if applicable. No additional management support is needed unless otherwise documented below in the visit note. 

## 2014-09-01 NOTE — Progress Notes (Signed)
   Subjective:    Patient ID: Hannah Hart, female    DOB: Jun 28, 1950, 64 y.o.   MRN: [id number]  HPI  Hannah Hart is a 64 yo female with a CC of itching on chest/abdomen/back.   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 Removed 2 ticks (hip and behind knee) that were attached, she reports they were engorged. No signs of bulls eye rash, no problems at the sites, denies N/V/D or headaches. She reports she checks daily for ticks since she is outside and doubts feeding was longer than 24 hours.  Allegra- today    Review of Systems  Constitutional: Negative for fever, chills, diaphoresis and fatigue.  Eyes: Negative for visual disturbance.  Respiratory: Negative for chest tightness, shortness of breath and wheezing.   Cardiovascular: Negative for chest pain, palpitations and leg swelling.  Skin: Positive for rash.       Papular rash      Objective:   Physical Exam  Constitutional: She is oriented to person, place, and time. She appears well-developed and well-nourished. No distress.  BP 112/60 mmHg  Pulse 91  Temp(Src) 98.1 F (36.7 C) (Oral)  Resp 12  Ht 5' 7.75" (1.721 m)  Wt 119 lb 12.8 oz (54.341 kg)  BMI 18.35 kg/m2  SpO2 96%   HENT:  Head: Normocephalic and atraumatic.  Right Ear: External ear normal.  Left Ear: External ear normal.  Eyes: EOM are normal. Pupils are equal, round, and reactive to light. Right eye exhibits no discharge. Left eye exhibits no discharge. No scleral icterus.  Cardiovascular: Normal rate, regular rhythm and normal heart sounds.  Exam reveals no gallop and no friction rub.   No murmur heard. Pulmonary/Chest: Effort normal and breath sounds normal. No respiratory distress. She has no wheezes. She has no rales. She exhibits no tenderness.  Neurological: She is alert and oriented to person, place, and time. No cranial nerve deficit. She exhibits normal muscle tone. Coordination normal.  Skin:  Skin is warm and dry. Rash noted. She is not diaphoretic.  Papular rash, generalized, no signs of infection, no erythema migrans noted  Psychiatric: She has a normal mood and affect. Her behavior is normal. Judgment and thought content normal.      Assessment & Plan:

## 2014-09-01 NOTE — Assessment & Plan Note (Signed)
Improving, pt works outside with Forensic psychologist. She checks daily for ticks and has removed 2 on 08/19/14. They were not attached longer than 24 hours, which removes Lyme from differential. She is out of the time frame for symptoms of RMSF. Will continue OTC treatment, add a benadryl at night for itching and call us if anything changes. Gave pt handout with symptoms to watch for with tick bites.

## 2015-01-01 ENCOUNTER — Other Ambulatory Visit (INDEPENDENT_AMBULATORY_CARE_PROVIDER_SITE_OTHER): Payer: BLUE CROSS/BLUE SHIELD

## 2015-01-01 ENCOUNTER — Telehealth: Payer: Self-pay | Admitting: *Deleted

## 2015-01-01 DIAGNOSIS — M810 Age-related osteoporosis without current pathological fracture: Secondary | ICD-10-CM

## 2015-01-01 DIAGNOSIS — Z13220 Encounter for screening for lipoid disorders: Secondary | ICD-10-CM | POA: Diagnosis not present

## 2015-01-01 DIAGNOSIS — D649 Anemia, unspecified: Secondary | ICD-10-CM

## 2015-01-01 LAB — LIPID PANEL
Cholesterol: 220 mg/dL — ABNORMAL HIGH (ref 0–200)
HDL: 95.7 mg/dL (ref >39.00)
LDL CALC: 112 mg/dL — AB (ref 0–99)
NONHDL: 124.03
TRIGLYCERIDES: 58 mg/dL (ref 0.0–149.0)
Total CHOL/HDL Ratio: 2
VLDL: 11.6 mg/dL (ref 0.0–40.0)

## 2015-01-01 LAB — CBC WITH DIFFERENTIAL/PLATELET
Basophils Absolute: 0 10*3/uL (ref 0.0–0.1)
Basophils Relative: 0.8 % (ref 0.0–3.0)
EOS ABS: 0.1 10*3/uL (ref 0.0–0.7)
Eosinophils Relative: 1.9 % (ref 0.0–5.0)
HCT: 39.8 % (ref 36.0–46.0)
Hemoglobin: 13 g/dL (ref 12.0–15.0)
LYMPHS PCT: 35.6 % (ref 12.0–46.0)
Lymphs Abs: 2.1 10*3/uL (ref 0.7–4.0)
MCHC: 32.7 g/dL (ref 30.0–36.0)
MCV: 94.3 fl (ref 78.0–100.0)
MONOS PCT: 6.8 % (ref 3.0–12.0)
Monocytes Absolute: 0.4 10*3/uL (ref 0.1–1.0)
NEUTROS PCT: 54.9 % (ref 43.0–77.0)
Neutro Abs: 3.2 10*3/uL (ref 1.4–7.7)
Platelets: 267 10*3/uL (ref 150.0–400.0)
RBC: 4.22 Mil/uL (ref 3.87–5.11)
RDW: 13.7 % (ref 11.5–15.5)
WBC: 5.9 10*3/uL (ref 4.0–10.5)

## 2015-01-01 LAB — COMPREHENSIVE METABOLIC PANEL
ALK PHOS: 79 U/L (ref 39–117)
ALT: 16 U/L (ref 0–35)
AST: 21 U/L (ref 0–37)
Albumin: 4.2 g/dL (ref 3.5–5.2)
BILIRUBIN TOTAL: 0.7 mg/dL (ref 0.2–1.2)
BUN: 13 mg/dL (ref 6–23)
CO2: 30 meq/L (ref 19–32)
CREATININE: 0.71 mg/dL (ref 0.40–1.20)
Calcium: 9.8 mg/dL (ref 8.4–10.5)
Chloride: 104 mEq/L (ref 96–112)
GFR: 88.72 mL/min (ref >60.00)
GLUCOSE: 93 mg/dL (ref 70–99)
Potassium: 4.7 mEq/L (ref 3.5–5.1)
Sodium: 141 mEq/L (ref 135–145)
TOTAL PROTEIN: 6.9 g/dL (ref 6.0–8.3)

## 2015-01-01 LAB — VITAMIN D 25 HYDROXY (VIT D DEFICIENCY, FRACTURES): VITD: 28.37 ng/mL — AB (ref 30.00–100.00)

## 2015-01-01 LAB — TSH: TSH: 2.92 u[IU]/mL (ref 0.35–4.50)

## 2015-01-01 NOTE — Telephone Encounter (Signed)
Orders placed for labs

## 2015-01-01 NOTE — Telephone Encounter (Signed)
Labs and dx?  

## 2015-01-05 ENCOUNTER — Encounter: Payer: Self-pay | Admitting: Internal Medicine

## 2015-01-05 ENCOUNTER — Ambulatory Visit (INDEPENDENT_AMBULATORY_CARE_PROVIDER_SITE_OTHER): Payer: BLUE CROSS/BLUE SHIELD | Admitting: Internal Medicine

## 2015-01-05 VITALS — BP 120/60 | HR 76 | Temp 98.5°F | Ht 67.75 in | Wt 119.0 lb

## 2015-01-05 DIAGNOSIS — Z0000 Encounter for general adult medical examination without abnormal findings: Secondary | ICD-10-CM | POA: Diagnosis not present

## 2015-01-05 DIAGNOSIS — C4491 Basal cell carcinoma of skin, unspecified: Secondary | ICD-10-CM

## 2015-01-05 DIAGNOSIS — R87619 Unspecified abnormal cytological findings in specimens from cervix uteri: Secondary | ICD-10-CM

## 2015-01-05 DIAGNOSIS — Z1211 Encounter for screening for malignant neoplasm of colon: Secondary | ICD-10-CM | POA: Diagnosis not present

## 2015-01-05 DIAGNOSIS — Z91048 Other nonmedicinal substance allergy status: Secondary | ICD-10-CM

## 2015-01-05 DIAGNOSIS — M810 Age-related osteoporosis without current pathological fracture: Secondary | ICD-10-CM

## 2015-01-05 DIAGNOSIS — D649 Anemia, unspecified: Secondary | ICD-10-CM

## 2015-01-05 DIAGNOSIS — R634 Abnormal weight loss: Secondary | ICD-10-CM

## 2015-01-05 DIAGNOSIS — Z9109 Other allergy status, other than to drugs and biological substances: Secondary | ICD-10-CM

## 2015-01-05 NOTE — Progress Notes (Signed)
Pre-visit discussion using our clinic review tool. No additional management support is needed unless otherwise documented below in the visit note.  

## 2015-01-05 NOTE — Progress Notes (Signed)
Patient ID: Hannah Hart, female   DOB: 08-11-1950, 64 y.o.   MRN: [id number]   Subjective:    Patient ID: Hannah Hart, female    DOB: 01/29/1951, 64 y.o.   MRN: [id number]  HPI  Patient here to follow up on her current medical issues as well for a complete physical exam.  She is staying active.  Works at a [employer].  Keeps her active.  Helps with stress.  Feels she is handling stress well.  No cardiac symptoms with increased activity or exertion.  No sob.  Eating and drinking well.  Bowels stable.    Past Medical History  Diagnosis Date  . History of chicken pox   . History of abnormal Pap smear   . Environmental allergies   . Uterine fibroid    Past Surgical History  Procedure Laterality Date  . S/p intervention for uterine fibroids     Family History  Problem Relation Age of Onset  . Hypertension Mother   . Lung cancer Father     died age 36  . Atrial fibrillation Mother   . Cancer Maternal Uncle     mouth and throat cancer  . Heart disease      aunt  . CVA      aunt  . Heart disease Maternal Aunt     s/p CABG   Social History   Social History  . Marital Status: Married    Spouse Name: N/A  . Number of Children: N/A  . Years of Education: N/A   Social History Main Topics  . Smoking status: Never Smoker   . Smokeless tobacco: Never Used  . Alcohol Use: No  . Drug Use: No  . Sexual Activity: Not Asked   Other Topics Concern  . None   Social History Narrative    Outpatient Encounter Prescriptions as of 01/05/2015  Medication Sig  . calcium carbonate (OS-CAL) 1250 (500 CA) MG chewable tablet Chew 1 tablet by mouth daily.  . fexofenadine (ALLEGRA) 180 MG tablet Take 180 mg by mouth as needed.  . fluticasone (FLONASE) 50 MCG/ACT nasal spray Place 2 sprays into the nose as needed for rhinitis.  Marland Kitchen ibuprofen (ADVIL,MOTRIN) 200 MG tablet Take 200 mg by mouth every 6 (six) hours as needed for pain.   No facility-administered encounter  medications on file as of 01/05/2015.    Review of Systems  Constitutional: Negative for appetite change and unexpected weight change.  HENT: Negative for congestion and sinus pressure.   Eyes: Negative for pain and visual disturbance.  Respiratory: Negative for cough, chest tightness and shortness of breath.   Cardiovascular: Negative for chest pain, palpitations and leg swelling.  Gastrointestinal: Negative for nausea, vomiting, abdominal pain and diarrhea.  Genitourinary: Negative for dysuria and difficulty urinating.  Musculoskeletal: Negative for back pain and joint swelling.  Skin: Negative for color change and rash.  Neurological: Negative for dizziness, light-headedness and headaches.  Hematological: Negative for adenopathy. Does not bruise/bleed easily.  Psychiatric/Behavioral: Negative for dysphoric mood and agitation.       Objective:    Physical Exam  Constitutional: She is oriented to person, place, and time. She appears well-developed and well-nourished.  HENT:  Nose: Nose normal.  Mouth/Throat: Oropharynx is clear and moist.  Eyes: Right eye exhibits no discharge. Left eye exhibits no discharge. No scleral icterus.  Neck: Neck supple. No thyromegaly present.  Cardiovascular: Normal rate and regular rhythm.   Pulmonary/Chest: Breath sounds normal. No accessory muscle usage. No  tachypnea. No respiratory distress. She has no decreased breath sounds. She has no wheezes. She has no rhonchi. Right breast exhibits no inverted nipple, no mass, no nipple discharge and no tenderness (no axillary adenopathy). Left breast exhibits no inverted nipple, no mass, no nipple discharge and no tenderness (no axilarry adenopathy).  Abdominal: Soft. Bowel sounds are normal. There is no tenderness.  Musculoskeletal: She exhibits no edema or tenderness.  Lymphadenopathy:    She has no cervical adenopathy.  Neurological: She is alert and oriented to person, place, and time.  Skin: Skin is  warm. No rash noted.  Psychiatric: She has a normal mood and affect. Her behavior is normal.    BP 120/60 mmHg  Pulse 76  Temp(Src) 98.5 F (36.9 C) (Oral)  Ht 5' 7.75" (1.721 m)  Wt 119 lb (53.978 kg)  BMI 18.22 kg/m2  SpO2 99% Wt Readings from Last 3 Encounters:  01/05/15 119 lb (53.978 kg)  09/01/14 119 lb 12.8 oz (54.341 kg)  02/24/14 119 lb (53.978 kg)     Lab Results  Component Value Date   WBC 5.9 01/01/2015   HGB 13.0 01/01/2015   HCT 39.8 01/01/2015   PLT 267.0 01/01/2015   GLUCOSE 93 01/01/2015   CHOL 220* 01/01/2015   TRIG 58.0 01/01/2015   HDL 95.70 01/01/2015   LDLDIRECT 99.2 12/26/2012   LDLCALC 112* 01/01/2015   ALT 16 01/01/2015   AST 21 01/01/2015   NA 141 01/01/2015   K 4.7 01/01/2015   CL 104 01/01/2015   CREATININE 0.71 01/01/2015   BUN 13 01/01/2015   CO2 30 01/01/2015   TSH 2.92 01/01/2015       Assessment & Plan:   Problem List Items Addressed This Visit    Abnormal Pap smear of cervix    Sees Dr Doree Fudge.  Up to date with pap smears.        Anemia    Last hgb wnl.  Follow cbc.        Basal cell carcinoma    Is followed by dermatology at [facility].       Environmental allergies    Uses allegra and nasacort.  Stable.        Health care maintenance    Physical today.  Gets her pelvic and pap smears through gyn.  Due colonoscopy.  Last 2006.  Refer to Dr Olevia Perches.  Mammogram 06/2013 - birads II (after f/u views).  She wants to skip f/u mammogram this year.       Osteoporosis    Bone density as outlined in last visit.  Discussed treatment options.  Continue weight bearing exercise, calcium and vitamin D.  Follow.       Relevant Medications   calcium carbonate (OS-CAL) 1250 (500 CA) MG chewable tablet   Weight loss    Weight is stable from last check.  Follow.         Other Visit Diagnoses    Colon cancer screening    -  Primary    Relevant Orders    Ambulatory referral to Gastroenterology        Einar Pheasant, MD

## 2015-01-10 ENCOUNTER — Encounter: Payer: Self-pay | Admitting: Internal Medicine

## 2015-01-10 DIAGNOSIS — Z0000 Encounter for general adult medical examination without abnormal findings: Secondary | ICD-10-CM | POA: Insufficient documentation

## 2015-01-10 NOTE — Assessment & Plan Note (Signed)
Weight is stable from last check.  Follow.

## 2015-01-10 NOTE — Assessment & Plan Note (Signed)
Is followed by dermatology at [facility].

## 2015-01-10 NOTE — Assessment & Plan Note (Signed)
Uses allegra and nasacort.  Stable.

## 2015-01-10 NOTE — Assessment & Plan Note (Signed)
Last hgb wnl.  Follow cbc.  

## 2015-01-10 NOTE — Assessment & Plan Note (Signed)
Physical today.  Gets her pelvic and pap smears through gyn.  Due colonoscopy.  Last 2006.  Refer to Dr Olevia Perches.  Mammogram 06/2013 - birads II (after f/u views).  She wants to skip f/u mammogram this year.

## 2015-01-10 NOTE — Assessment & Plan Note (Signed)
Sees Dr Doree Fudge.  Up to date with pap smears.

## 2015-01-10 NOTE — Assessment & Plan Note (Signed)
Bone density as outlined in last visit.  Discussed treatment options.  Continue weight bearing exercise, calcium and vitamin D.  Follow.

## 2015-05-18 ENCOUNTER — Encounter: Payer: Self-pay | Admitting: Family Medicine

## 2015-05-18 ENCOUNTER — Ambulatory Visit (INDEPENDENT_AMBULATORY_CARE_PROVIDER_SITE_OTHER): Payer: BLUE CROSS/BLUE SHIELD | Admitting: Family Medicine

## 2015-05-18 VITALS — BP 132/78 | HR 93 | Temp 98.7°F | Ht 67.75 in | Wt 123.2 lb

## 2015-05-18 DIAGNOSIS — J329 Chronic sinusitis, unspecified: Secondary | ICD-10-CM | POA: Insufficient documentation

## 2015-05-18 DIAGNOSIS — J019 Acute sinusitis, unspecified: Secondary | ICD-10-CM

## 2015-05-18 MED ORDER — AMOXICILLIN-POT CLAVULANATE 875-125 MG PO TABS: 1.0000 | ORAL_TABLET | Freq: Two times a day (BID) | ORAL | Status: DC

## 2015-05-18 NOTE — Progress Notes (Signed)
Subjective:  Patient ID: Hannah Hart, female    DOB: [birth year]  Age: 65 y.o. MRN: [id number]  CC: Sinus issues  HPI:  65 year old female presents to clinic today with complaints of sinus issues.  Patient states that she has had significant sinus pressure and congestion and associated discolored mucus with blowing her nose. This is been going on since Christmas but worsened this Saturday. She states that she got better after Christmas but states that she never fully improved. She reports associated sore throat. She's been using Afrin, Allegra-D, and Nasacort without resolution. No associated fevers or chills. No known exacerbating factors.  Social Hx   Social History   Social History  . Marital Status: Married    Spouse Name: N/A  . Number of Children: N/A  . Years of Education: N/A   Social History Main Topics  . Smoking status: Never Smoker   . Smokeless tobacco: Never Used  . Alcohol Use: No  . Drug Use: No  . Sexual Activity: Not Asked   Other Topics Concern  . None   Social History Narrative   Review of Systems  Constitutional: Negative for fever and chills.  HENT: Positive for congestion, sinus pressure and sore throat.    Objective:  BP 132/78 mmHg  Pulse 93  Temp(Src) 98.7 F (37.1 C) (Oral)  Ht 5' 7.75" (1.721 m)  Wt 123 lb 4 oz (55.906 kg)  BMI 18.88 kg/m2  SpO2 98%  BP/Weight 05/18/2015 [id number]  Systolic BP [id number]  Diastolic BP 78 60 60  Wt. (Lbs) 123.25 119 119.8  BMI 18.88 18.22 18.35   Physical Exam  Constitutional: She is oriented to person, place, and time. She appears well-developed. No distress.  HENT:  Head: Normocephalic and atraumatic.  Right Ear: External ear normal.  Left Ear: External ear normal.  Oropharynx with mild erythema. Normal TMs bilaterally. Nose normal.   Eyes: Conjunctivae are normal.  Cardiovascular: Normal rate and regular rhythm.   Murmur heard. Pulmonary/Chest: Effort normal and breath sounds  normal.  Neurological: She is alert and oriented to person, place, and time.  Psychiatric: She has a normal mood and affect.  Vitals reviewed.  Lab Results  Component Value Date   WBC 5.9 01/01/2015   HGB 13.0 01/01/2015   HCT 39.8 01/01/2015   PLT 267.0 01/01/2015   GLUCOSE 93 01/01/2015   CHOL 220* 01/01/2015   TRIG 58.0 01/01/2015   HDL 95.70 01/01/2015   LDLDIRECT 99.2 12/26/2012   LDLCALC 112* 01/01/2015   ALT 16 01/01/2015   AST 21 01/01/2015   NA 141 01/01/2015   K 4.7 01/01/2015   CL 104 01/01/2015   CREATININE 0.71 01/01/2015   BUN 13 01/01/2015   CO2 30 01/01/2015   TSH 2.92 01/01/2015   Assessment & Plan:   Problem List Items Addressed This Visit    Sinusitis - Primary    New problem. Doubt bacterial etiology at this time. Advise continue use of Nasacort, antihistamine. Advised patient to add Nettipot. Rx for Augmentin given to be used only if she fails to improve or worsens over the next few days.      Relevant Medications   amoxicillin-clavulanate (AUGMENTIN) 875-125 MG tablet     Meds ordered this encounter  Medications  . amoxicillin-clavulanate (AUGMENTIN) 875-125 MG tablet    Sig: Take 1 tablet by mouth 2 (two) times daily.    Dispense:  20 tablet    Refill:  0    Follow-up: PRN  Blennerhassett

## 2015-05-18 NOTE — Progress Notes (Signed)
Pre visit review using our clinic review tool, if applicable. No additional management support is needed unless otherwise documented below in the visit note. 

## 2015-05-18 NOTE — Patient Instructions (Signed)
This is likely viral.  Continue your current medications/treatments. Add Nettipot.  Fill the antibiotic if you fail to improve in the next few days.  Take care  Dr. Lacinda Axon

## 2015-05-18 NOTE — Assessment & Plan Note (Signed)
New problem. Doubt bacterial etiology at this time. Advise continue use of Nasacort, antihistamine. Advised patient to add Nettipot. Rx for Augmentin given to be used only if she fails to improve or worsens over the next few days.

## 2015-12-31 ENCOUNTER — Telehealth: Payer: Self-pay

## 2015-12-31 DIAGNOSIS — Z13220 Encounter for screening for lipoid disorders: Secondary | ICD-10-CM

## 2015-12-31 DIAGNOSIS — M810 Age-related osteoporosis without current pathological fracture: Secondary | ICD-10-CM

## 2015-12-31 NOTE — Telephone Encounter (Signed)
Order placed for labs.

## 2015-12-31 NOTE — Telephone Encounter (Signed)
Pt coming for fasting labs 01/04/16. Please place future orders. Thank you.

## 2016-01-04 ENCOUNTER — Other Ambulatory Visit (INDEPENDENT_AMBULATORY_CARE_PROVIDER_SITE_OTHER): Payer: BLUE CROSS/BLUE SHIELD

## 2016-01-04 DIAGNOSIS — M810 Age-related osteoporosis without current pathological fracture: Secondary | ICD-10-CM | POA: Diagnosis not present

## 2016-01-04 DIAGNOSIS — Z13220 Encounter for screening for lipoid disorders: Secondary | ICD-10-CM

## 2016-01-04 LAB — COMPREHENSIVE METABOLIC PANEL
ALBUMIN: 4.4 g/dL (ref 3.5–5.2)
ALK PHOS: 76 U/L (ref 39–117)
ALT: 17 U/L (ref 0–35)
AST: 22 U/L (ref 0–37)
BILIRUBIN TOTAL: 0.4 mg/dL (ref 0.2–1.2)
BUN: 18 mg/dL (ref 6–23)
CO2: 30 mEq/L (ref 19–32)
Calcium: 9.6 mg/dL (ref 8.4–10.5)
Chloride: 102 mEq/L (ref 96–112)
Creatinine, Ser: 0.8 mg/dL (ref 0.40–1.20)
GFR: 77.05 mL/min (ref >60.00)
GLUCOSE: 96 mg/dL (ref 70–99)
Potassium: 4.4 mEq/L (ref 3.5–5.1)
Sodium: 137 mEq/L (ref 135–145)
TOTAL PROTEIN: 7.4 g/dL (ref 6.0–8.3)

## 2016-01-04 LAB — CBC WITH DIFFERENTIAL/PLATELET
BASOS ABS: 0.1 10*3/uL (ref 0.0–0.1)
Basophils Relative: 0.6 % (ref 0.0–3.0)
EOS PCT: 2.2 % (ref 0.0–5.0)
Eosinophils Absolute: 0.2 10*3/uL (ref 0.0–0.7)
HCT: 40.4 % (ref 36.0–46.0)
HEMOGLOBIN: 13.5 g/dL (ref 12.0–15.0)
LYMPHS ABS: 2.3 10*3/uL (ref 0.7–4.0)
LYMPHS PCT: 29.7 % (ref 12.0–46.0)
MCHC: 33.5 g/dL (ref 30.0–36.0)
MCV: 92.4 fl (ref 78.0–100.0)
MONOS PCT: 5.8 % (ref 3.0–12.0)
Monocytes Absolute: 0.5 10*3/uL (ref 0.1–1.0)
NEUTROS PCT: 61.7 % (ref 43.0–77.0)
Neutro Abs: 4.9 10*3/uL (ref 1.4–7.7)
Platelets: 260 10*3/uL (ref 150.0–400.0)
RBC: 4.37 Mil/uL (ref 3.87–5.11)
RDW: 13.2 % (ref 11.5–15.5)
WBC: 7.9 10*3/uL (ref 4.0–10.5)

## 2016-01-04 LAB — LIPID PANEL
Cholesterol: 222 mg/dL — ABNORMAL HIGH (ref 0–200)
HDL: 95.8 mg/dL (ref >39.00)
LDL Cholesterol: 113 mg/dL — ABNORMAL HIGH (ref 0–99)
NONHDL: 126.16
TRIGLYCERIDES: 67 mg/dL (ref 0.0–149.0)
Total CHOL/HDL Ratio: 2
VLDL: 13.4 mg/dL (ref 0.0–40.0)

## 2016-01-04 LAB — VITAMIN D 25 HYDROXY (VIT D DEFICIENCY, FRACTURES): VITD: 38.3 ng/mL (ref 30.00–100.00)

## 2016-01-04 LAB — TSH: TSH: 1.68 u[IU]/mL (ref 0.35–4.50)

## 2016-01-06 ENCOUNTER — Ambulatory Visit (INDEPENDENT_AMBULATORY_CARE_PROVIDER_SITE_OTHER): Payer: BLUE CROSS/BLUE SHIELD | Admitting: Internal Medicine

## 2016-01-06 ENCOUNTER — Encounter: Payer: Self-pay | Admitting: Internal Medicine

## 2016-01-06 VITALS — BP 118/60 | HR 83 | Temp 98.3°F | Ht 68.0 in | Wt 121.6 lb

## 2016-01-06 DIAGNOSIS — R87619 Unspecified abnormal cytological findings in specimens from cervix uteri: Secondary | ICD-10-CM

## 2016-01-06 DIAGNOSIS — Z0000 Encounter for general adult medical examination without abnormal findings: Secondary | ICD-10-CM | POA: Diagnosis not present

## 2016-01-06 DIAGNOSIS — R634 Abnormal weight loss: Secondary | ICD-10-CM | POA: Diagnosis not present

## 2016-01-06 DIAGNOSIS — R0989 Other specified symptoms and signs involving the circulatory and respiratory systems: Secondary | ICD-10-CM

## 2016-01-06 DIAGNOSIS — Z1211 Encounter for screening for malignant neoplasm of colon: Secondary | ICD-10-CM

## 2016-01-06 DIAGNOSIS — Z1239 Encounter for other screening for malignant neoplasm of breast: Secondary | ICD-10-CM | POA: Diagnosis not present

## 2016-01-06 DIAGNOSIS — M810 Age-related osteoporosis without current pathological fracture: Secondary | ICD-10-CM

## 2016-01-06 NOTE — Assessment & Plan Note (Signed)
Weight has overall been stable.  Follow.

## 2016-01-06 NOTE — Assessment & Plan Note (Addendum)
Physical today 01/06/16.  Gets her pelvic and pap smears through gyn.  She skipped mammogram last year.  Mammogram scheduled this year.  Schedule for colonoscopy.  Declined flu shot.

## 2016-01-06 NOTE — Assessment & Plan Note (Signed)
Bone density as outlined previously.  Continue calcium, vitamin D and weight bearing exercise.  Follow.

## 2016-01-06 NOTE — Progress Notes (Signed)
Pre visit review using our clinic review tool, if applicable. No additional management support is needed unless otherwise documented below in the visit note. 

## 2016-01-06 NOTE — Assessment & Plan Note (Signed)
Sees Dr Doree Fudge.  States up to date.

## 2016-01-06 NOTE — Assessment & Plan Note (Signed)
Previous carotid ultrasound reveals no hemodynamically significant stenosis.  Appears to be radiation from murmur.  Doing well.  Follow.

## 2016-01-06 NOTE — Progress Notes (Signed)
Patient ID: Hannah Hart, female   DOB: [birth year], 65 y.o.   MRN: [id number]   Subjective:    Patient ID: Hannah Hart, female    DOB: 02-14-1951, 65 y.o.   MRN: [id number]  HPI  Patient here for her physical exam.  She gets her pelvic and pap smears through gyn.  States she is up to date.  Stays active.  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  No chest pain.  No sob.  No acid reflux.  No abdominal pain or cramping.  Bowels stable.  Good appetite.    Past Medical History:  Diagnosis Date  . Environmental allergies   . History of abnormal Pap smear   . History of chicken pox   . Uterine fibroid    Past Surgical History:  Procedure Laterality Date  . s/p intervention for uterine fibroids     Family History  Problem Relation Age of Onset  . Hypertension Mother   . Atrial fibrillation Mother   . Lung cancer Father     died age 34  . Cancer Maternal Uncle     mouth and throat cancer  . Heart disease      aunt  . CVA      aunt  . Heart disease Maternal Aunt     s/p CABG   Social History   Social History  . Marital status: Married    Spouse name: N/A  . Number of children: N/A  . Years of education: N/A   Social History Main Topics  . Smoking status: Never Smoker  . Smokeless tobacco: Never Used  . Alcohol use No  . Drug use: No  . Sexual activity: Not Asked   Other Topics Concern  . None   Social History Narrative  . None    Outpatient Encounter Prescriptions as of 01/06/2016  Medication Sig  . calcium carbonate (OS-CAL) 1250 (500 CA) MG chewable tablet Chew 1 tablet by mouth daily.  . fexofenadine (ALLEGRA) 180 MG tablet Take 180 mg by mouth as needed.  . fluticasone (FLONASE) 50 MCG/ACT nasal spray Place 2 sprays into the nose as needed for rhinitis.  Marland Kitchen ibuprofen (ADVIL,MOTRIN) 200 MG tablet Take 200 mg by mouth every 6 (six) hours as needed for pain.  . [DISCONTINUED] amoxicillin-clavulanate (AUGMENTIN) 875-125 MG tablet Take 1 tablet by mouth 2 (two)  times daily. (Patient not taking: Reported on 01/06/2016)   No facility-administered encounter medications on file as of 01/06/2016.     Review of Systems  Constitutional: Negative for appetite change and unexpected weight change.  HENT: Negative for congestion and sinus pressure.   Eyes: Negative for pain and visual disturbance.  Respiratory: Negative for cough, chest tightness and shortness of breath.   Cardiovascular: Negative for chest pain, palpitations and leg swelling.  Gastrointestinal: Negative for abdominal pain, diarrhea, nausea and vomiting.  Genitourinary: Negative for difficulty urinating and dysuria.  Musculoskeletal: Negative for back pain and joint swelling.  Skin: Negative for color change and rash.  Neurological: Negative for dizziness, light-headedness and headaches.  Hematological: Negative for adenopathy. Does not bruise/bleed easily.  Psychiatric/Behavioral: Negative for agitation and dysphoric mood.       Objective:     Blood pressure rechecked by me:  128/78  Physical Exam  Constitutional: She is oriented to person, place, and time. She appears well-developed and well-nourished. No distress.  HENT:  Nose: Nose normal.  Mouth/Throat: Oropharynx is clear and moist.  Eyes: Right eye exhibits no discharge. Left eye exhibits  no discharge. No scleral icterus.  Neck: Neck supple. No thyromegaly present.  Cardiovascular: Normal rate and regular rhythm.   Pulmonary/Chest: Breath sounds normal. No accessory muscle usage. No tachypnea. No respiratory distress. She has no decreased breath sounds. She has no wheezes. She has no rhonchi. Right breast exhibits no inverted nipple, no mass, no nipple discharge and no tenderness (no axillary adenopathy). Left breast exhibits no inverted nipple, no mass, no nipple discharge and no tenderness (no axilarry adenopathy).  Abdominal: Soft. Bowel sounds are normal. There is no tenderness.  Musculoskeletal: She exhibits no edema or  tenderness.  Lymphadenopathy:    She has no cervical adenopathy.  Neurological: She is alert and oriented to person, place, and time.  Skin: Skin is warm. No rash noted. No erythema.  Psychiatric: She has a normal mood and affect. Her behavior is normal.    BP 118/60   Pulse 83   Temp 98.3 F (36.8 C) (Oral)   Ht 5\' 8"  (1.727 m)   Wt 121 lb 9.6 oz (55.2 kg)   SpO2 99%   BMI 18.49 kg/m  Wt Readings from Last 3 Encounters:  01/06/16 121 lb 9.6 oz (55.2 kg)  05/18/15 123 lb 4 oz (55.9 kg)  01/05/15 119 lb (54 kg)     Lab Results  Component Value Date   WBC 7.9 01/04/2016   HGB 13.5 01/04/2016   HCT 40.4 01/04/2016   PLT 260.0 01/04/2016   GLUCOSE 96 01/04/2016   CHOL 222 (H) 01/04/2016   TRIG 67.0 01/04/2016   HDL 95.80 01/04/2016   LDLDIRECT 99.2 12/26/2012   LDLCALC 113 (H) 01/04/2016   ALT 17 01/04/2016   AST 22 01/04/2016   NA 137 01/04/2016   K 4.4 01/04/2016   CL 102 01/04/2016   CREATININE 0.80 01/04/2016   BUN 18 01/04/2016   CO2 30 01/04/2016   TSH 1.68 01/04/2016       Assessment & Plan:   Problem List Items Addressed This Visit    Abnormal Pap smear of cervix    Sees Dr Doree Fudge.  States up to date.        Health care maintenance    Physical today 01/06/16.  Gets her pelvic and pap smears through gyn.  She skipped mammogram last year.  Mammogram scheduled this year.  Schedule for colonoscopy.  Declined flu shot.        Osteoporosis    Bone density as outlined previously.  Continue calcium, vitamin D and weight bearing exercise.  Follow.       Right carotid bruit    Previous carotid ultrasound reveals no hemodynamically significant stenosis.  Appears to be radiation from murmur.  Doing well.  Follow.       Weight loss    Weight has overall been stable.  Follow.         Other Visit Diagnoses    Screening breast examination    -  Primary   Relevant Orders   MM Digital Screening   Colon cancer screening       Relevant Orders   Ambulatory  referral to Gastroenterology       Einar Pheasant, MD

## 2016-01-07 ENCOUNTER — Encounter: Payer: Self-pay | Admitting: Internal Medicine

## 2016-02-01 ENCOUNTER — Ambulatory Visit
Admission: RE | Admit: 2016-02-01 | Discharge: 2016-02-01 | Disposition: A | Discharge status: Home / Self Care | Payer: BLUE CROSS/BLUE SHIELD | Source: Ambulatory Visit | Attending: Internal Medicine | Admitting: Internal Medicine

## 2016-02-01 DIAGNOSIS — Z1231 Encounter for screening mammogram for malignant neoplasm of breast: Secondary | ICD-10-CM | POA: Diagnosis not present

## 2016-02-01 DIAGNOSIS — Z1239 Encounter for other screening for malignant neoplasm of breast: Secondary | ICD-10-CM

## 2016-08-04 ENCOUNTER — Ambulatory Visit: Admit: 2016-08-04 | Payer: BLUE CROSS/BLUE SHIELD | Admitting: Unknown Physician Specialty

## 2016-08-04 SURGERY — COLONOSCOPY WITH PROPOFOL
Anesthesia: General

## 2016-12-26 ENCOUNTER — Other Ambulatory Visit: Payer: Self-pay | Admitting: Internal Medicine

## 2016-12-26 ENCOUNTER — Telehealth: Payer: Self-pay | Admitting: Radiology

## 2016-12-26 DIAGNOSIS — Z13220 Encounter for screening for lipoid disorders: Secondary | ICD-10-CM

## 2016-12-26 DIAGNOSIS — D649 Anemia, unspecified: Secondary | ICD-10-CM

## 2016-12-26 DIAGNOSIS — M810 Age-related osteoporosis without current pathological fracture: Secondary | ICD-10-CM

## 2016-12-26 NOTE — Telephone Encounter (Signed)
Orders placed for labs

## 2016-12-26 NOTE — Progress Notes (Signed)
Orders placed for labs

## 2016-12-26 NOTE — Telephone Encounter (Signed)
Pt coming in next Friday for labs, please place future orders. Thank you.

## 2017-01-05 ENCOUNTER — Other Ambulatory Visit (INDEPENDENT_AMBULATORY_CARE_PROVIDER_SITE_OTHER): Payer: BLUE CROSS/BLUE SHIELD

## 2017-01-05 DIAGNOSIS — D649 Anemia, unspecified: Secondary | ICD-10-CM | POA: Diagnosis not present

## 2017-01-05 DIAGNOSIS — Z13220 Encounter for screening for lipoid disorders: Secondary | ICD-10-CM

## 2017-01-05 DIAGNOSIS — M810 Age-related osteoporosis without current pathological fracture: Secondary | ICD-10-CM

## 2017-01-05 LAB — VITAMIN D 25 HYDROXY (VIT D DEFICIENCY, FRACTURES): VITD: 43 ng/mL (ref 30.00–100.00)

## 2017-01-05 LAB — CBC WITH DIFFERENTIAL/PLATELET
BASOS PCT: 0.8 % (ref 0.0–3.0)
Basophils Absolute: 0.1 10*3/uL (ref 0.0–0.1)
EOS PCT: 0.9 % (ref 0.0–5.0)
Eosinophils Absolute: 0.1 10*3/uL (ref 0.0–0.7)
HEMATOCRIT: 39.3 % (ref 36.0–46.0)
Hemoglobin: 13.1 g/dL (ref 12.0–15.0)
LYMPHS PCT: 17.4 % (ref 12.0–46.0)
Lymphs Abs: 1.8 10*3/uL (ref 0.7–4.0)
MCHC: 33.5 g/dL (ref 30.0–36.0)
MCV: 95.5 fl (ref 78.0–100.0)
MONOS PCT: 6.9 % (ref 3.0–12.0)
Monocytes Absolute: 0.7 10*3/uL (ref 0.1–1.0)
NEUTROS ABS: 7.6 10*3/uL (ref 1.4–7.7)
Neutrophils Relative %: 74 % (ref 43.0–77.0)
PLATELETS: 283 10*3/uL (ref 150.0–400.0)
RBC: 4.11 Mil/uL (ref 3.87–5.11)
RDW: 13.1 % (ref 11.5–15.5)
WBC: 10.3 10*3/uL (ref 4.0–10.5)

## 2017-01-05 LAB — COMPREHENSIVE METABOLIC PANEL
ALBUMIN: 4.4 g/dL (ref 3.5–5.2)
ALT: 20 U/L (ref 0–35)
AST: 28 U/L (ref 0–37)
Alkaline Phosphatase: 74 U/L (ref 39–117)
BUN: 16 mg/dL (ref 6–23)
CALCIUM: 9.9 mg/dL (ref 8.4–10.5)
CHLORIDE: 102 meq/L (ref 96–112)
CO2: 29 meq/L (ref 19–32)
Creatinine, Ser: 0.77 mg/dL (ref 0.40–1.20)
GFR: 80.27 mL/min (ref >60.00)
Glucose, Bld: 91 mg/dL (ref 70–99)
POTASSIUM: 4.4 meq/L (ref 3.5–5.1)
SODIUM: 138 meq/L (ref 135–145)
Total Bilirubin: 0.7 mg/dL (ref 0.2–1.2)
Total Protein: 6.9 g/dL (ref 6.0–8.3)

## 2017-01-05 LAB — LIPID PANEL
CHOL/HDL RATIO: 2
Cholesterol: 230 mg/dL — ABNORMAL HIGH (ref 0–200)
HDL: 97.1 mg/dL (ref >39.00)
LDL CALC: 124 mg/dL — AB (ref 0–99)
NONHDL: 132.98
Triglycerides: 46 mg/dL (ref 0.0–149.0)
VLDL: 9.2 mg/dL (ref 0.0–40.0)

## 2017-01-05 LAB — TSH: TSH: 1.23 u[IU]/mL (ref 0.35–4.50)

## 2017-01-08 ENCOUNTER — Ambulatory Visit (INDEPENDENT_AMBULATORY_CARE_PROVIDER_SITE_OTHER): Payer: BLUE CROSS/BLUE SHIELD | Admitting: Internal Medicine

## 2017-01-08 ENCOUNTER — Encounter: Payer: Self-pay | Admitting: Internal Medicine

## 2017-01-08 ENCOUNTER — Other Ambulatory Visit (HOSPITAL_COMMUNITY)
Admission: RE | Admit: 2017-01-08 | Discharge: 2017-01-08 | Disposition: A | Discharge status: Home / Self Care | Payer: BLUE CROSS/BLUE SHIELD | Source: Ambulatory Visit | Attending: Internal Medicine | Admitting: Internal Medicine

## 2017-01-08 VITALS — BP 118/58 | HR 72 | Temp 98.7°F | Resp 14 | Ht 67.72 in | Wt 114.8 lb

## 2017-01-08 DIAGNOSIS — Z124 Encounter for screening for malignant neoplasm of cervix: Secondary | ICD-10-CM | POA: Diagnosis present

## 2017-01-08 DIAGNOSIS — R634 Abnormal weight loss: Secondary | ICD-10-CM

## 2017-01-08 DIAGNOSIS — E348 Other specified endocrine disorders: Secondary | ICD-10-CM | POA: Diagnosis not present

## 2017-01-08 DIAGNOSIS — J3489 Other specified disorders of nose and nasal sinuses: Secondary | ICD-10-CM

## 2017-01-08 DIAGNOSIS — Z0000 Encounter for general adult medical examination without abnormal findings: Secondary | ICD-10-CM | POA: Diagnosis not present

## 2017-01-08 DIAGNOSIS — R319 Hematuria, unspecified: Secondary | ICD-10-CM | POA: Diagnosis not present

## 2017-01-08 DIAGNOSIS — Z1231 Encounter for screening mammogram for malignant neoplasm of breast: Secondary | ICD-10-CM | POA: Diagnosis not present

## 2017-01-08 DIAGNOSIS — M810 Age-related osteoporosis without current pathological fracture: Secondary | ICD-10-CM

## 2017-01-08 DIAGNOSIS — R87619 Unspecified abnormal cytological findings in specimens from cervix uteri: Secondary | ICD-10-CM

## 2017-01-08 DIAGNOSIS — Z1239 Encounter for other screening for malignant neoplasm of breast: Secondary | ICD-10-CM

## 2017-01-08 MED ORDER — MUPIROCIN 2 % EX OINT: 1.0000 "application " | TOPICAL_OINTMENT | Freq: Two times a day (BID) | CUTANEOUS | 0 refills | Status: DC

## 2017-01-08 NOTE — Assessment & Plan Note (Signed)
Physical today 01/08/17.  PAP 01/08/17.  Had previously been followed by gyn.  Mammogram 02/01/16 - Birads I.  Scheduled for f/u mammogram.

## 2017-01-08 NOTE — Assessment & Plan Note (Signed)
Continue dietary calcium and weight bearing exercise.  Reschedule bone density.

## 2017-01-08 NOTE — Assessment & Plan Note (Signed)
Previously worked up by Dr Bernardo Heater.  Per pt, ultrasound and urine cytology negative.  Last urinalysis clear.

## 2017-01-08 NOTE — Addendum Note (Signed)
Addended by: Francella Solian on: 01/08/2017 10:06 AM   Modules accepted: Orders

## 2017-01-08 NOTE — Assessment & Plan Note (Signed)
Weight loss as outlined.  She relates to the increased stress with her brother's health issues.  States she is eating better now.  No symptoms.  Will follow.

## 2017-01-08 NOTE — Assessment & Plan Note (Signed)
Has been seeing Dr Doree Fudge.  Repeat pap today.

## 2017-01-08 NOTE — Progress Notes (Signed)
Patient ID: Hannah Hart, female   DOB: [birth year], 66 y.o.   MRN: [id number]   Subjective:    Patient ID: Hannah Hart, female    DOB: 1950-05-24, 66 y.o.   MRN: [id number]  HPI  Patient here for her physical exam.  Previously had her pelvic and pap smears through gyn.  Was seeing Dr Doree Fudge.  Wanted to have done here today.  Stays active.  No chest pain.  No sob.  No acid reflux.  No abdominal pain.  Bowels moving.  Increased stress.  Her brother had another stroke.  She is staying with him at night.  Had not been eating regularly.  Lost weight.  Is eating more regular now and feels she is gaining back some of her weight.  Had a right intranasal lesion.  Is some better.  Would like ointment.     Past Medical History:  Diagnosis Date  . Environmental allergies   . History of abnormal Pap smear   . History of chicken pox   . Uterine fibroid    Past Surgical History:  Procedure Laterality Date  . s/p intervention for uterine fibroids     Family History  Problem Relation Age of Onset  . Hypertension Mother   . Atrial fibrillation Mother   . Lung cancer Father        died age 104  . Cancer Maternal Uncle        mouth and throat cancer  . Heart disease Unknown        aunt  . CVA Unknown        aunt  . Heart disease Maternal Aunt        s/p CABG   Social History   Social History  . Marital status: Married    Spouse name: N/A  . Number of children: N/A  . Years of education: N/A   Social History Main Topics  . Smoking status: Never Smoker  . Smokeless tobacco: Never Used  . Alcohol use No  . Drug use: No  . Sexual activity: Not Asked   Other Topics Concern  . None   Social History Narrative  . None    Outpatient Encounter Prescriptions as of 01/08/2017  Medication Sig  . fexofenadine (ALLEGRA) 180 MG tablet Take 180 mg by mouth as needed.  . fluticasone (FLONASE) 50 MCG/ACT nasal spray Place 2 sprays into the nose as needed for rhinitis.  Marland Kitchen ibuprofen  (ADVIL,MOTRIN) 200 MG tablet Take 200 mg by mouth every 6 (six) hours as needed for pain.  . Multiple Vitamins-Minerals (ALIVE ENERGY 50+ PO) Take by mouth.  . [DISCONTINUED] calcium carbonate (OS-CAL) 1250 (500 CA) MG chewable tablet Chew 1 tablet by mouth daily.  . mupirocin ointment (BACTROBAN) 2 % Place 1 application into the nose 2 (two) times daily.   No facility-administered encounter medications on file as of 01/08/2017.     Review of Systems  Constitutional: Negative for appetite change and unexpected weight change.  HENT: Negative for congestion and sinus pressure.   Eyes: Negative for pain and visual disturbance.  Respiratory: Negative for cough, chest tightness and shortness of breath.   Cardiovascular: Negative for chest pain, palpitations and leg swelling.  Gastrointestinal: Negative for abdominal pain, diarrhea, nausea and vomiting.  Genitourinary: Negative for difficulty urinating and dysuria.  Musculoskeletal: Negative for back pain and joint swelling.  Skin: Negative for color change and rash.  Neurological: Negative for dizziness, light-headedness and headaches.  Hematological: Negative for adenopathy. Does not bruise/bleed  easily.  Psychiatric/Behavioral: Negative for agitation and dysphoric mood.       Objective:     Blood pressure rechecked by me:  118/62  Physical Exam  Constitutional: She is oriented to person, place, and time. She appears well-developed and well-nourished. No distress.  HENT:  Nose: Nose normal.  Mouth/Throat: Oropharynx is clear and moist.  Eyes: Right eye exhibits no discharge. Left eye exhibits no discharge. No scleral icterus.  Neck: Neck supple. No thyromegaly present.  Cardiovascular: Normal rate and regular rhythm.   Pulmonary/Chest: Breath sounds normal. No accessory muscle usage. No tachypnea. No respiratory distress. She has no decreased breath sounds. She has no wheezes. She has no rhonchi. Right breast exhibits no inverted  nipple, no mass, no nipple discharge and no tenderness (no axillary adenopathy). Left breast exhibits no inverted nipple, no mass, no nipple discharge and no tenderness (no axilarry adenopathy).  Abdominal: Soft. Bowel sounds are normal. There is no tenderness.  Genitourinary:  Genitourinary Comments: Normal external genitalia.  Vaginal vault without lesions.  Cervix identified.  Pap smear performed.  Could not appreciate any adnexal masses or tenderness.    Musculoskeletal: She exhibits no edema or tenderness.  Lymphadenopathy:    She has no cervical adenopathy.  Neurological: She is alert and oriented to person, place, and time.  Skin: Skin is warm. No rash noted. No erythema.  Psychiatric: She has a normal mood and affect. Her behavior is normal.    BP (!) 118/58 (BP Location: Left Arm, Patient Position: Sitting, Cuff Size: Normal)   Pulse 72   Temp 98.7 F (37.1 C) (Oral)   Resp 14   Ht 5' 7.72" (1.72 m)   Wt 114 lb 12.8 oz (52.1 kg)   SpO2 99%   BMI 17.60 kg/m  Wt Readings from Last 3 Encounters:  01/08/17 114 lb 12.8 oz (52.1 kg)  01/06/16 121 lb 9.6 oz (55.2 kg)  05/18/15 123 lb 4 oz (55.9 kg)     Lab Results  Component Value Date   WBC 10.3 01/05/2017   HGB 13.1 01/05/2017   HCT 39.3 01/05/2017   PLT 283.0 01/05/2017   GLUCOSE 91 01/05/2017   CHOL 230 (H) 01/05/2017   TRIG 46.0 01/05/2017   HDL 97.10 01/05/2017   LDLDIRECT 99.2 12/26/2012   LDLCALC 124 (H) 01/05/2017   ALT 20 01/05/2017   AST 28 01/05/2017   NA 138 01/05/2017   K 4.4 01/05/2017   CL 102 01/05/2017   CREATININE 0.77 01/05/2017   BUN 16 01/05/2017   CO2 29 01/05/2017   TSH 1.23 01/05/2017    Mm Screening Breast Tomo Bilateral  Result Date: 02/01/2016 CLINICAL DATA:  Screening. EXAM: 2D DIGITAL SCREENING BILATERAL MAMMOGRAM WITH CAD AND ADJUNCT TOMO COMPARISON:  Previous exam(s). ACR Breast Density Category c: The breast tissue is heterogeneously dense, which may obscure small masses.  FINDINGS: There are no findings suspicious for malignancy. Images were processed with CAD. IMPRESSION: No mammographic evidence of malignancy. A result letter of this screening mammogram will be mailed directly to the patient. RECOMMENDATION: Screening mammogram in one year. (Code:SM-B-01Y) BI-RADS CATEGORY  1: Negative. Electronically Signed   By: Curlene Dolphin M.D.   On: 02/01/2016 16:44       Assessment & Plan:   Problem List Items Addressed This Visit    Abnormal Pap smear of cervix    Has been seeing Dr Doree Fudge.  Repeat pap today.       Health care maintenance    Physical  today 01/08/17.  PAP 01/08/17.  Had previously been followed by gyn.  Mammogram 02/01/16 - Birads I.  Scheduled for f/u mammogram.        Hematuria    Previously worked up by Dr Bernardo Heater.  Per pt, ultrasound and urine cytology negative.  Last urinalysis clear.        Osteoporosis    Continue dietary calcium and weight bearing exercise.  Reschedule bone density.        Weight loss    Weight loss as outlined.  She relates to the increased stress with her brother's health issues.  States she is eating better now.  No symptoms.  Will follow.         Other Visit Diagnoses    Estradiol deficiency    -  Primary   Relevant Orders   DG Bone Density   Screening for breast cancer       Relevant Orders   MM DIGITAL SCREENING BILATERAL   Internal nasal lesion       Bactroban ointment as directed.  notify me if persistent.         Einar Pheasant, MD

## 2017-01-09 LAB — CYTOLOGY - PAP
Diagnosis: NEGATIVE
HPV (WINDOPATH): NOT DETECTED

## 2017-02-19 ENCOUNTER — Ambulatory Visit
Admission: RE | Admit: 2017-02-19 | Discharge: 2017-02-19 | Disposition: A | Discharge status: Home / Self Care | Payer: BLUE CROSS/BLUE SHIELD | Source: Ambulatory Visit | Attending: Internal Medicine | Admitting: Internal Medicine

## 2017-02-19 DIAGNOSIS — M85851 Other specified disorders of bone density and structure, right thigh: Secondary | ICD-10-CM | POA: Diagnosis not present

## 2017-02-19 DIAGNOSIS — Z1231 Encounter for screening mammogram for malignant neoplasm of breast: Secondary | ICD-10-CM | POA: Insufficient documentation

## 2017-02-19 DIAGNOSIS — E348 Other specified endocrine disorders: Secondary | ICD-10-CM | POA: Diagnosis present

## 2017-02-19 DIAGNOSIS — Z1239 Encounter for other screening for malignant neoplasm of breast: Secondary | ICD-10-CM

## 2017-02-19 DIAGNOSIS — M810 Age-related osteoporosis without current pathological fracture: Secondary | ICD-10-CM | POA: Insufficient documentation

## 2017-04-09 ENCOUNTER — Ambulatory Visit: Payer: BLUE CROSS/BLUE SHIELD | Admitting: Internal Medicine

## 2017-04-27 ENCOUNTER — Ambulatory Visit: Payer: Self-pay

## 2017-04-27 MED ORDER — PREDNISONE 10 MG PO TABS: ORAL_TABLET | ORAL | 0 refills | Status: DC

## 2017-04-27 MED ORDER — AMOXICILLIN-POT CLAVULANATE 875-125 MG PO TABS: 1.0000 | ORAL_TABLET | Freq: Two times a day (BID) | ORAL | 0 refills | Status: DC

## 2017-04-27 NOTE — Telephone Encounter (Signed)
FYI, trying home advice first refused appt. Wanted ABX called to pharmacy.

## 2017-04-27 NOTE — Telephone Encounter (Signed)
Patient notified nad voiced understanding to orders given.

## 2017-04-27 NOTE — Telephone Encounter (Signed)
augmentin twice daily x 7 days and 6 day prednisone taper called in.  Please take a probiotic ( Align, Floraque or Culturelle) while you are on the antibiotic to prevent a serious antibiotic associated diarrhea  Called clostirudium dificile colitis and a vaginal yeast infection

## 2017-04-27 NOTE — Telephone Encounter (Signed)
Pt calling with c/o sinus pressure and left upper tooth sensitivity, post nasl drip, sore throat and occasional chills. Home care advice given. Pt asked if an abx could be called in. Advised pt that she would need to have OV. Offered to make appt but pt to try home advice first.  Reason for Disposition . [1] Sinus congestion as part of a cold AND [2] present < 10 days  Answer Assessment - Initial Assessment Questions 1. LOCATION: "Where does it hurt?"      Headache, left upper tooth sensitivity 2. ONSET: "When did the sinus pain start?"  (e.g., hours, days)      1 week ago 3. SEVERITY: "How bad is the pain?"   (Scale 1-10; mild, moderate or severe)   - MILD (1-3): doesn't interfere with normal activities    - MODERATE (4-7): interferes with normal activities (e.g., work or school) or awakens from sleep   - SEVERE (8-10): excruciating pain and patient unable to do any normal activities        7 4. RECURRENT SYMPTOM: "Have you ever had sinus problems before?" If so, ask: "When was the last time?" and "What happened that time?"      Yes the last time was in the last year- took antibiotics 5. NASAL CONGESTION: "Is the nose blocked?" If so, ask, "Can you open it or must you breathe through the mouth?"     Nose is blocked   Is mouth breathing 6. NASAL DISCHARGE: "Do you have discharge from your nose?" If so ask, "What color?"     Mainly post nasal drip.  7. FEVER: "Do you have a fever?" If so, ask: "What is it, how was it measured, and when did it start?"      Occasional chills but no documented fever with a thermometer 8. OTHER SYMPTOMS: "Do you have any other symptoms?" (e.g., sore throat, cough, earache, difficulty breathing)     Sore throat headache earache (like pressure) can tell galnds are swollen, no difficulty breathing 9. PREGNANCY: "Is there any chance you are pregnant?" "When was your last menstrual period?"     n/a  Protocols used: SINUS PAIN OR CONGESTION-A-AH

## 2017-04-27 NOTE — Addendum Note (Signed)
Addended by: Crecencio Mc on: 04/27/2017 04:46 PM   Modules accepted: Orders

## 2017-10-27 IMAGING — MG 2D DIGITAL SCREENING BILATERAL MAMMOGRAM WITH CAD AND ADJUNCT TO
9 of 12 series · 9 of 28 positions shown · non-contrast
Comparison: Previous exam(s).

CLINICAL DATA: Screening.

EXAM:
2D DIGITAL SCREENING BILATERAL MAMMOGRAM WITH CAD AND ADJUNCT TOMO

[R MLO synth-2D]
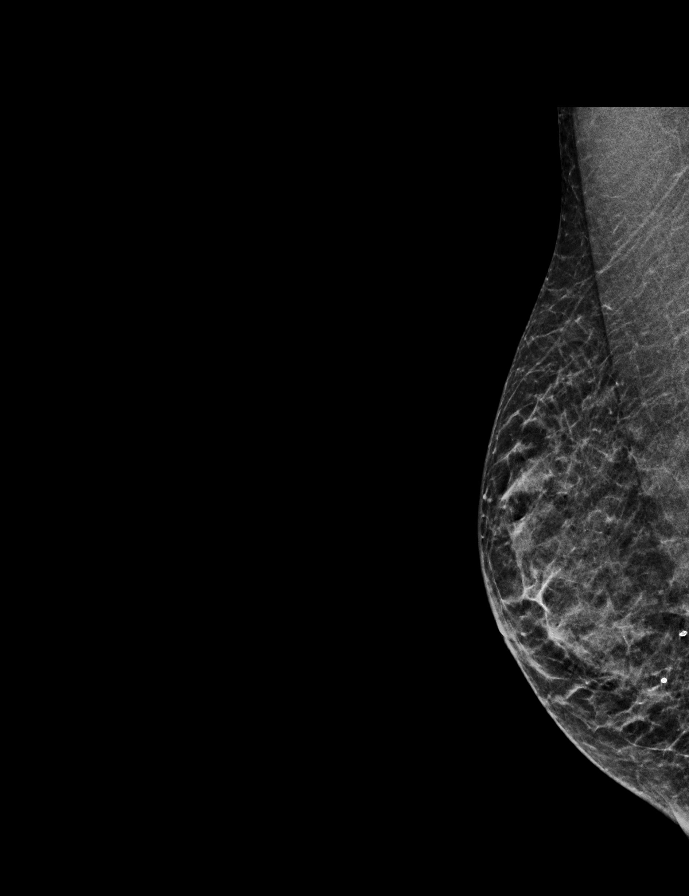

[R MLO]
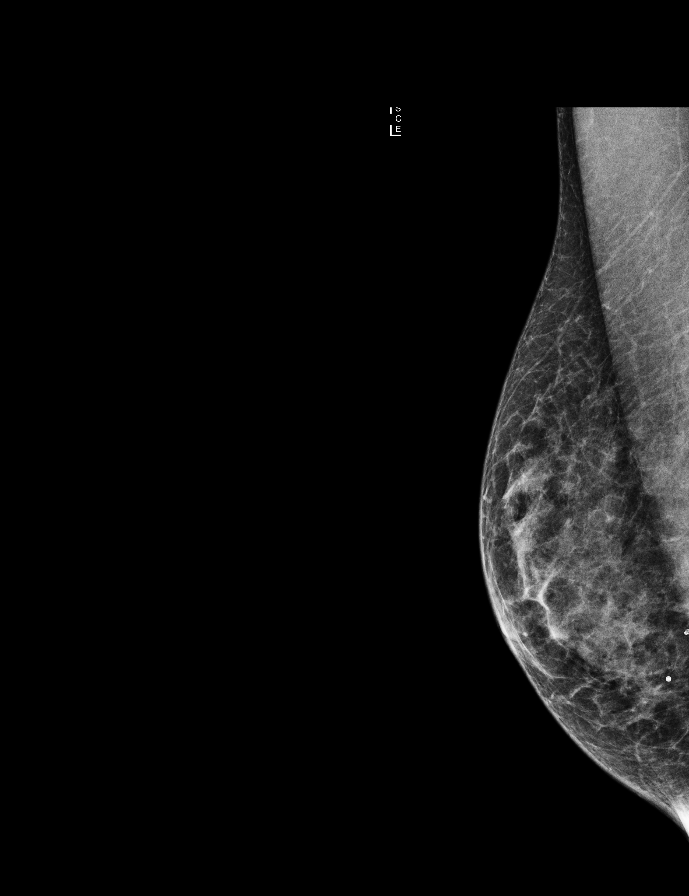

[R CC synth-2D]
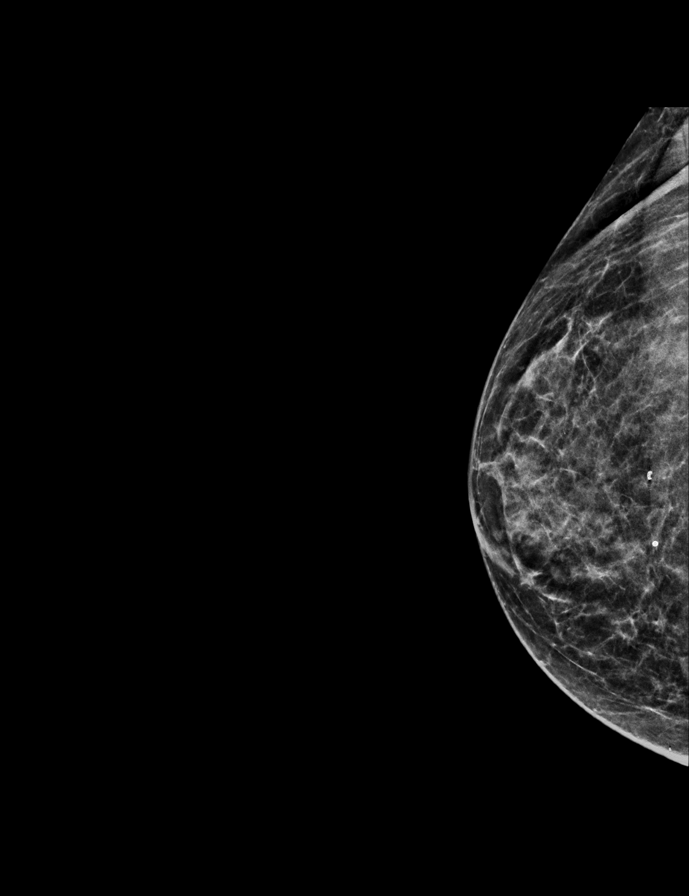

[L MLO]
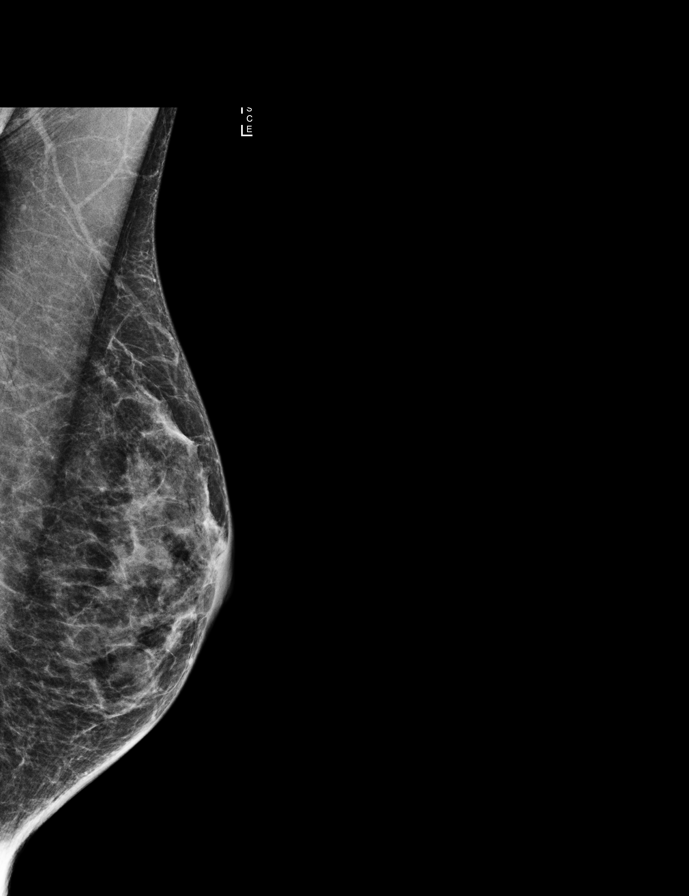

[L CC synth-2D]
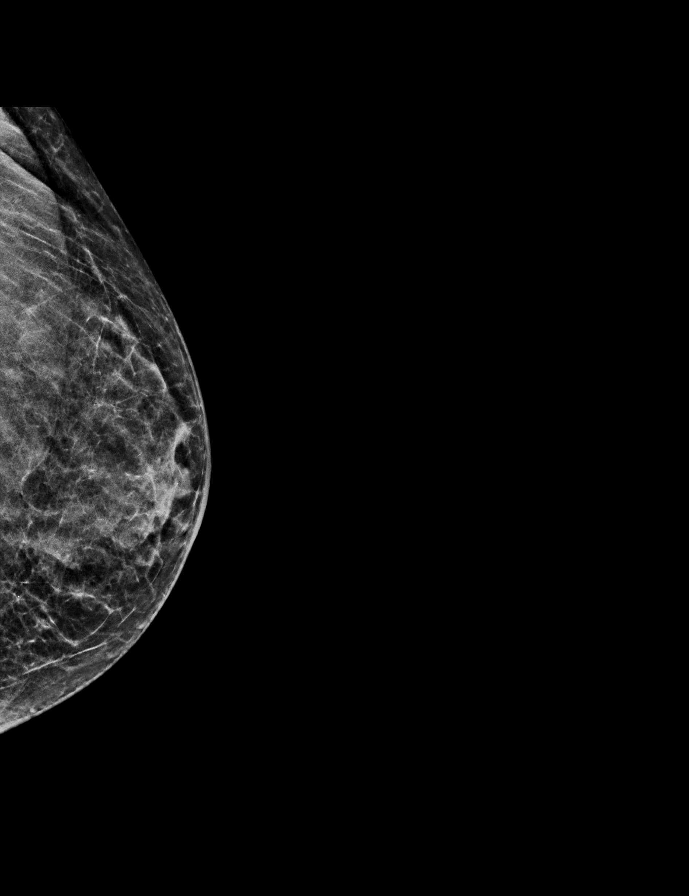

[R CC]
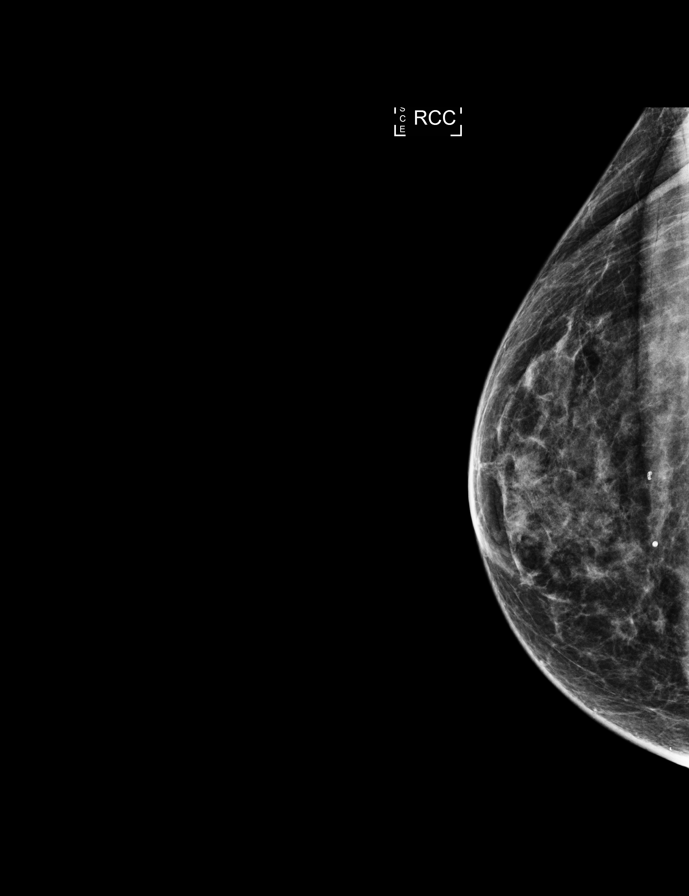

[L MLO synth-2D]
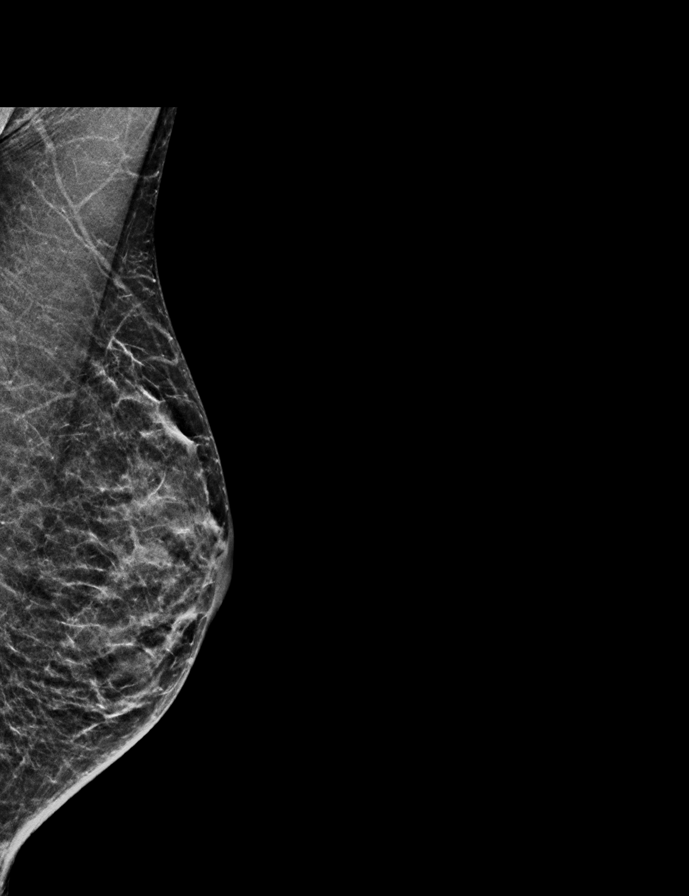

[L CC]
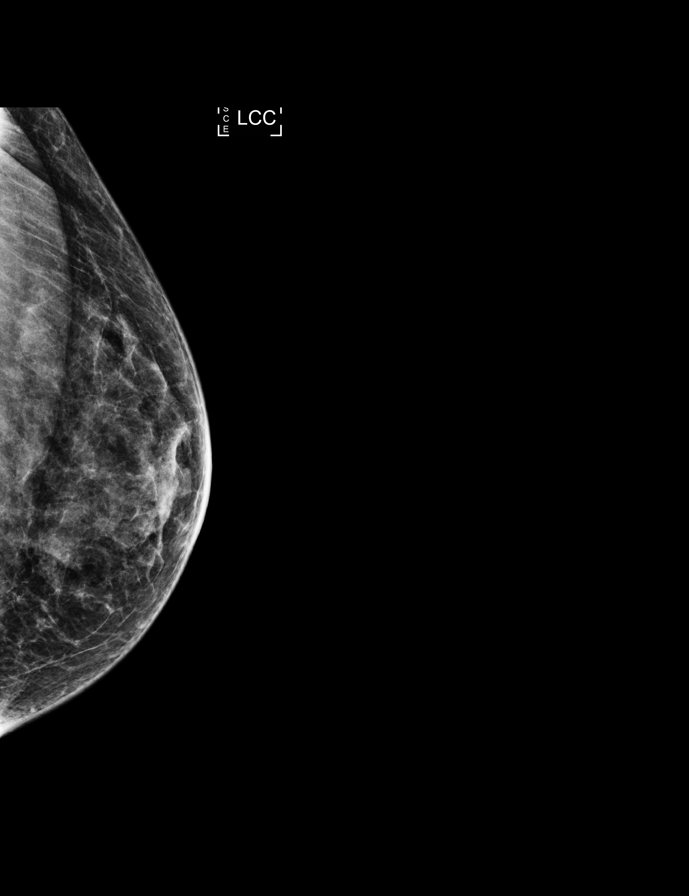

[L MLO tomo · tomo slice 19/36.0]
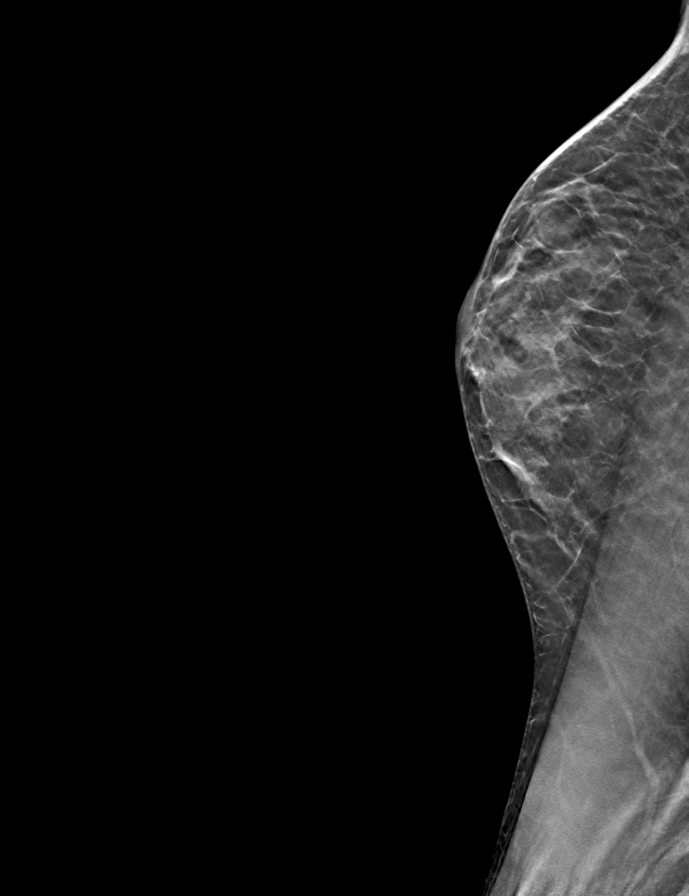

[9 of 28 positions shown; findings below may reference images not displayed]

ACR Breast Density Category c: The breast tissue is heterogeneously
dense, which may obscure small masses.
FINDINGS: There are no findings suspicious for malignancy. Images were
processed with CAD.
IMPRESSION: No mammographic evidence of malignancy. A result letter of this
screening mammogram will be mailed directly to the patient.

RECOMMENDATION:
Screening mammogram in one year. (Code:[id number]-[id number])

BI-RADS CATEGORY  1: Negative.

## 2018-01-08 ENCOUNTER — Other Ambulatory Visit: Payer: Self-pay | Admitting: Internal Medicine

## 2018-01-08 ENCOUNTER — Telehealth: Payer: Self-pay | Admitting: Radiology

## 2018-01-08 DIAGNOSIS — D649 Anemia, unspecified: Secondary | ICD-10-CM

## 2018-01-08 DIAGNOSIS — M810 Age-related osteoporosis without current pathological fracture: Secondary | ICD-10-CM

## 2018-01-08 NOTE — Telephone Encounter (Signed)
Order placed for f/u labs.  

## 2018-01-08 NOTE — Progress Notes (Signed)
Order placed for f/u labs.  

## 2018-01-08 NOTE — Telephone Encounter (Signed)
Pt coming in for labs tomorrow, please place future orders. Thank you.  

## 2018-01-09 ENCOUNTER — Other Ambulatory Visit (INDEPENDENT_AMBULATORY_CARE_PROVIDER_SITE_OTHER): Payer: BLUE CROSS/BLUE SHIELD

## 2018-01-09 DIAGNOSIS — D649 Anemia, unspecified: Secondary | ICD-10-CM | POA: Diagnosis not present

## 2018-01-09 DIAGNOSIS — M810 Age-related osteoporosis without current pathological fracture: Secondary | ICD-10-CM

## 2018-01-09 LAB — COMPREHENSIVE METABOLIC PANEL
ALK PHOS: 79 U/L (ref 39–117)
ALT: 18 U/L (ref 0–35)
AST: 26 U/L (ref 0–37)
Albumin: 4.3 g/dL (ref 3.5–5.2)
BILIRUBIN TOTAL: 0.7 mg/dL (ref 0.2–1.2)
BUN: 18 mg/dL (ref 6–23)
CO2: 29 mEq/L (ref 19–32)
Calcium: 9.8 mg/dL (ref 8.4–10.5)
Chloride: 101 mEq/L (ref 96–112)
Creatinine, Ser: 0.76 mg/dL (ref 0.40–1.20)
GFR: 81.23 mL/min (ref >60.00)
GLUCOSE: 99 mg/dL (ref 70–99)
POTASSIUM: 4.1 meq/L (ref 3.5–5.1)
SODIUM: 138 meq/L (ref 135–145)
TOTAL PROTEIN: 7 g/dL (ref 6.0–8.3)

## 2018-01-09 LAB — CBC WITH DIFFERENTIAL/PLATELET
Basophils Absolute: 0.1 10*3/uL (ref 0.0–0.1)
Basophils Relative: 1.2 % (ref 0.0–3.0)
EOS PCT: 0.9 % (ref 0.0–5.0)
Eosinophils Absolute: 0.1 10*3/uL (ref 0.0–0.7)
HCT: 37.5 % (ref 36.0–46.0)
Hemoglobin: 12.6 g/dL (ref 12.0–15.0)
LYMPHS ABS: 1.9 10*3/uL (ref 0.7–4.0)
Lymphocytes Relative: 21.7 % (ref 12.0–46.0)
MCHC: 33.6 g/dL (ref 30.0–36.0)
MCV: 92.1 fl (ref 78.0–100.0)
Monocytes Absolute: 0.6 10*3/uL (ref 0.1–1.0)
Monocytes Relative: 6.9 % (ref 3.0–12.0)
NEUTROS ABS: 6 10*3/uL (ref 1.4–7.7)
NEUTROS PCT: 69.3 % (ref 43.0–77.0)
PLATELETS: 273 10*3/uL (ref 150.0–400.0)
RBC: 4.07 Mil/uL (ref 3.87–5.11)
RDW: 13 % (ref 11.5–15.5)
WBC: 8.7 10*3/uL (ref 4.0–10.5)

## 2018-01-09 LAB — LIPID PANEL
CHOLESTEROL: 231 mg/dL — AB (ref 0–200)
HDL: 106.5 mg/dL (ref >39.00)
LDL Cholesterol: 112 mg/dL — ABNORMAL HIGH (ref 0–99)
NonHDL: 124.22
Total CHOL/HDL Ratio: 2
Triglycerides: 60 mg/dL (ref 0.0–149.0)
VLDL: 12 mg/dL (ref 0.0–40.0)

## 2018-01-09 LAB — VITAMIN D 25 HYDROXY (VIT D DEFICIENCY, FRACTURES): VITD: 34.82 ng/mL (ref 30.00–100.00)

## 2018-01-09 LAB — TSH: TSH: 1.38 u[IU]/mL (ref 0.35–4.50)

## 2018-01-14 ENCOUNTER — Ambulatory Visit (INDEPENDENT_AMBULATORY_CARE_PROVIDER_SITE_OTHER): Payer: BLUE CROSS/BLUE SHIELD | Admitting: Internal Medicine

## 2018-01-14 VITALS — BP 128/70 | HR 76 | Temp 98.2°F | Resp 18 | Ht 68.0 in | Wt 118.4 lb

## 2018-01-14 DIAGNOSIS — Z1231 Encounter for screening mammogram for malignant neoplasm of breast: Secondary | ICD-10-CM

## 2018-01-14 DIAGNOSIS — M810 Age-related osteoporosis without current pathological fracture: Secondary | ICD-10-CM | POA: Diagnosis not present

## 2018-01-14 DIAGNOSIS — Z1239 Encounter for other screening for malignant neoplasm of breast: Secondary | ICD-10-CM

## 2018-01-14 DIAGNOSIS — Z9109 Other allergy status, other than to drugs and biological substances: Secondary | ICD-10-CM

## 2018-01-14 DIAGNOSIS — Z0000 Encounter for general adult medical examination without abnormal findings: Secondary | ICD-10-CM | POA: Diagnosis not present

## 2018-01-14 MED ORDER — MUPIROCIN 2 % EX OINT: 1.0000 "application " | TOPICAL_OINTMENT | Freq: Two times a day (BID) | CUTANEOUS | 0 refills | Status: DC

## 2018-01-14 NOTE — Progress Notes (Signed)
Patient ID: Nura Cahoon, female   DOB: [birth year], 67 y.o.   MRN: [id number]   Subjective:    Patient ID: Makaiyah Schweiger, female    DOB: October 13, 1950, 67 y.o.   MRN: [id number]  HPI  Patient here for her physical exam.  She reports she has been doing well.  Still taking care of her brother.  Handling stress.  Stays active.  No chest pain.  No sob.  No acid reflux.  No abdominal pain.  Bowels moving.  No urine change.  Discussed labs.     Past Medical History:  Diagnosis Date  . Environmental allergies   . History of abnormal Pap smear   . History of chicken pox   . Uterine fibroid    Past Surgical History:  Procedure Laterality Date  . s/p intervention for uterine fibroids     Family History  Problem Relation Age of Onset  . Hypertension Mother   . Atrial fibrillation Mother   . Lung cancer Father        died age 13  . Cancer Maternal Uncle        mouth and throat cancer  . Heart disease Unknown        aunt  . CVA Unknown        aunt  . Heart disease Maternal Aunt        s/p CABG   Social History   Socioeconomic History  . Marital status: Married    Spouse name: Not on file  . Number of children: Not on file  . Years of education: Not on file  . Highest education level: Not on file  Occupational History  . Not on file  Social Needs  . Financial resource strain: Not on file  . Food insecurity:    Worry: Not on file    Inability: Not on file  . Transportation needs:    Medical: Not on file    Non-medical: Not on file  Tobacco Use  . Smoking status: Never Smoker  . Smokeless tobacco: Never Used  Substance and Sexual Activity  . Alcohol use: No    Alcohol/week: 0.0 standard drinks  . Drug use: No  . Sexual activity: Not on file  Lifestyle  . Physical activity:    Days per week: Not on file    Minutes per session: Not on file  . Stress: Not on file  Relationships  . Social connections:    Talks on phone: Not on file    Gets together: Not on file   Attends religious service: Not on file    Active member of club or organization: Not on file    Attends meetings of clubs or organizations: Not on file    Relationship status: Not on file  Other Topics Concern  . Not on file  Social History Narrative  . Not on file    Outpatient Encounter Medications as of 01/14/2018  Medication Sig  . fexofenadine (ALLEGRA) 180 MG tablet Take 180 mg by mouth as needed.  . fluticasone (FLONASE) 50 MCG/ACT nasal spray Place 2 sprays into the nose as needed for rhinitis.  Marland Kitchen ibuprofen (ADVIL,MOTRIN) 200 MG tablet Take 200 mg by mouth every 6 (six) hours as needed for pain.  . Multiple Vitamins-Minerals (ALIVE ENERGY 50+ PO) Take by mouth.  . mupirocin ointment (BACTROBAN) 2 % Place 1 application into the nose 2 (two) times daily.  . [DISCONTINUED] mupirocin ointment (BACTROBAN) 2 % Place 1 application into the nose 2 (two)  times daily.  . [DISCONTINUED] amoxicillin-clavulanate (AUGMENTIN) 875-125 MG tablet Take 1 tablet by mouth 2 (two) times daily.  . [DISCONTINUED] predniSONE (DELTASONE) 10 MG tablet 6 tablets on Day 1 , then reduce by 1 tablet daily until gone   No facility-administered encounter medications on file as of 01/14/2018.     Review of Systems  Constitutional: Negative for appetite change and unexpected weight change.  HENT: Negative for congestion and sinus pressure.   Eyes: Negative for pain and visual disturbance.  Respiratory: Negative for cough, chest tightness and shortness of breath.   Cardiovascular: Negative for chest pain, palpitations and leg swelling.  Gastrointestinal: Negative for abdominal pain, diarrhea, nausea and vomiting.  Genitourinary: Negative for difficulty urinating and dysuria.  Musculoskeletal: Negative for joint swelling and myalgias.  Skin: Negative for color change and rash.  Neurological: Negative for dizziness, light-headedness and headaches.  Hematological: Negative for adenopathy. Does not bruise/bleed  easily.  Psychiatric/Behavioral: Negative for agitation and dysphoric mood.       Objective:     Blood pressure rechecked by me:  122/68  Physical Exam  Constitutional: She is oriented to person, place, and time. She appears well-developed and well-nourished. No distress.  HENT:  Nose: Nose normal.  Mouth/Throat: Oropharynx is clear and moist.  Eyes: Right eye exhibits no discharge. Left eye exhibits no discharge. No scleral icterus.  Neck: Neck supple. No thyromegaly present.  Cardiovascular: Normal rate and regular rhythm.  Pulmonary/Chest: Breath sounds normal. No accessory muscle usage. No tachypnea. No respiratory distress. She has no decreased breath sounds. She has no wheezes. She has no rhonchi. Right breast exhibits no inverted nipple, no mass, no nipple discharge and no tenderness (no axillary adenopathy). Left breast exhibits no inverted nipple, no mass, no nipple discharge and no tenderness (no axilarry adenopathy).  Abdominal: Soft. Bowel sounds are normal. There is no tenderness.  Musculoskeletal: She exhibits no edema or tenderness.  Lymphadenopathy:    She has no cervical adenopathy.  Neurological: She is alert and oriented to person, place, and time.  Skin: No rash noted. No erythema.  Psychiatric: She has a normal mood and affect. Her behavior is normal.    BP 128/70 (BP Location: Left Arm, Patient Position: Sitting, Cuff Size: Normal)   Pulse 76   Temp 98.2 F (36.8 C) (Oral)   Resp 18   Ht 5\' 8"  (1.727 m)   Wt 118 lb 6.4 oz (53.7 kg)   SpO2 99%   BMI 18.00 kg/m  Wt Readings from Last 3 Encounters:  01/14/18 118 lb 6.4 oz (53.7 kg)  01/08/17 114 lb 12.8 oz (52.1 kg)  01/06/16 121 lb 9.6 oz (55.2 kg)     Lab Results  Component Value Date   WBC 8.7 01/09/2018   HGB 12.6 01/09/2018   HCT 37.5 01/09/2018   PLT 273.0 01/09/2018   GLUCOSE 99 01/09/2018   CHOL 231 (H) 01/09/2018   TRIG 60.0 01/09/2018   HDL 106.50 01/09/2018   LDLDIRECT 99.2  12/26/2012   LDLCALC 112 (H) 01/09/2018   ALT 18 01/09/2018   AST 26 01/09/2018   NA 138 01/09/2018   K 4.1 01/09/2018   CL 101 01/09/2018   CREATININE 0.76 01/09/2018   BUN 18 01/09/2018   CO2 29 01/09/2018   TSH 1.38 01/09/2018    Dg Bone Density  Result Date: 02/19/2017 EXAM: DUAL X-RAY ABSORPTIOMETRY (DXA) FOR BONE MINERAL DENSITY IMPRESSION: Dear Einar Pheasant, Your patient Tobin Cadiente completed a BMD test on 02/19/2017 using  the [employer] DXA System (analysis version: 14.10) manufactured by [employer]. The following summarizes the results of our evaluation. PATIENT BIOGRAPHICAL: Name: Tyniya, Kuyper Patient ID: [id number] Birth Date: July 02, 1950 Height: 67.0 in. Gender: Female Exam Date: 02/19/2017 Weight: 112.7 lbs. Indications: Caucasian, Postmenopausal Fractures: Treatments: Flonase ASSESSMENT: The BMD measured at AP Spine L1-L3 is 0.866 g/cm2 with a T-score of -2.6. This patient is considered osteoporotic according to [facility]) criteria. L-4 was excluded due to degenerative changes. Site Region Measured Measured WHO Young Adult BMD Date       Age      Classification T-score AP Spine L1-L3 4/19/2017 63.9 Osteoporosis -2.6 0.866 g/cm2 DualFemur Neck Right 02/19/2017 63.9 Osteopenia -2.3 0.723 g/cm2 World Health Organization [facility]) criteria for post-menopausal, Caucasian Women: Normal:       T-score at or above -1 SD Osteopenia:   T-score between -1 and -2.5 SD Osteoporosis: T-score at or below -2.5 SD RECOMMENDATIONS: [place] recommends that FDA-approved medical therapies be considered in postmenopausal women and men age 71 or older with a: 1. Hip or vertebral (clinical or morphometric) fracture. 2. T-score of < -2.5 at the spine or hip. 3. Ten-year fracture probability by FRAX of 3% or greater for hip fracture or 20% or greater for major osteoporotic fracture. All treatment decisions require clinical judgment and consideration of individual  patient factors, including patient preferences, co-morbidities, previous drug use, risk factors not captured in the FRAX model (e.g. falls, vitamin D deficiency, increased bone turnover, interval significant decline in bone density) and possible under - or over-estimation of fracture risk by FRAX. All patients should ensure an adequate intake of dietary calcium (1200 mg/d) and vitamin D (800 IU daily) unless contraindicated. FOLLOW-UP: People with diagnosed cases of osteoporosis or at high risk for fracture should have regular bone mineral density tests. For patients eligible for Medicare, routine testing is allowed once every 2 years. The testing frequency can be increased to one year for patients who have rapidly progressing disease, those who are receiving or discontinuing medical therapy to restore bone mass, or have additional risk factors. I have reviewed this report, and agree with the above findings. [facility] Dear Einar Pheasant, Your patient Adonica Fukushima completed a FRAX assessment on 02/19/2017 using the [place] (analysis version: 14.10) manufactured by [employer]. The following summarizes the results of our evaluation. PATIENT BIOGRAPHICAL: Name: Siana, Panameno Patient ID: [id number] Birth Date: 02-09-1951 Height:    67.0 in. Gender:     [age]    [age]       Weight:    112.7 lbs. Ethnicity:  White                            Exam Date: 02/19/2017 FRAX* RESULTS:  (version: 3.5) 10-year Probability of Fracture1 Major Osteoporotic Fracture2 Hip Fracture 9.8% 2.0% Population: [place] (Caucasian) Risk Factors: None Based on Femur (Left) Neck BMD 1 -The 10-year probability of fracture may be lower than reported if the patient has received treatment. 2 -Major Osteoporotic Fracture: Clinical Spine, Forearm, Hip or Shoulder *FRAX is a Materials engineer of the [employer] for Metabolic Bone Disease, a [place] (WHO) [employer]. ASSESSMENT: The probability of a major osteoporotic fracture is 9.8% within the next ten years. The probability of a hip fracture is 2.0% within the next ten years. . Electronically Signed  By: Marijo Conception, M.D.   On: 02/19/2017 10:34   Mm Screening Breast Tomo Bilateral  Result Date: 02/19/2017 CLINICAL DATA:  Screening. EXAM: 2D DIGITAL SCREENING BILATERAL MAMMOGRAM WITH CAD AND ADJUNCT TOMO COMPARISON:  Previous exam(s). ACR Breast Density Category c: The breast tissue is heterogeneously dense, which may obscure small masses. FINDINGS: There are no findings suspicious for malignancy. Images were processed with CAD. IMPRESSION: No mammographic evidence of malignancy. A result letter of this screening mammogram will be mailed directly to the patient. RECOMMENDATION: Screening mammogram in one year. (Code:SM-B-01Y) BI-RADS CATEGORY  1: Negative. Electronically Signed   By: Nolon Nations M.D.   On: 02/19/2017 09:46       Assessment & Plan:   Problem List Items Addressed This Visit    Environmental allergies    Controlled.        Health care maintenance    Physical today 01/14/18.  PAP 01/08/17 - negative with negative HPV.  Mammogram 02/19/17 - birads I.  Needs colonoscopy. Did not schedule.  Agrees now.  Will place order for referral.  Wants to do after first of the year.        Osteoporosis    Discussed osteoporosis.  Desires no prescription medication.         Other Visit Diagnoses    Routine general medical examination at a health care facility    -  Primary   Breast cancer screening       Relevant Orders   MM 3D SCREEN BREAST BILATERAL       Einar Pheasant, MD

## 2018-01-14 NOTE — Assessment & Plan Note (Addendum)
Physical today 01/14/18.  PAP 01/08/17 - negative with negative HPV.  Mammogram 02/19/17 - birads I.  Needs colonoscopy. Did not schedule.  Agrees now.  Will place order for referral.  Wants to do after first of the year.

## 2018-01-19 ENCOUNTER — Encounter: Payer: Self-pay | Admitting: Internal Medicine

## 2018-01-19 NOTE — Assessment & Plan Note (Signed)
Controlled.  

## 2018-01-19 NOTE — Assessment & Plan Note (Signed)
Discussed osteoporosis.  Desires no prescription medication.

## 2018-02-20 ENCOUNTER — Ambulatory Visit
Admission: RE | Admit: 2018-02-20 | Discharge: 2018-02-20 | Disposition: A | Discharge status: Home / Self Care | Payer: BLUE CROSS/BLUE SHIELD | Source: Ambulatory Visit | Attending: Internal Medicine | Admitting: Internal Medicine

## 2018-02-20 DIAGNOSIS — Z1239 Encounter for other screening for malignant neoplasm of breast: Secondary | ICD-10-CM | POA: Diagnosis present

## 2019-01-16 ENCOUNTER — Telehealth: Payer: Self-pay | Admitting: *Deleted

## 2019-01-16 ENCOUNTER — Other Ambulatory Visit (INDEPENDENT_AMBULATORY_CARE_PROVIDER_SITE_OTHER): Payer: Medicare Other

## 2019-01-16 ENCOUNTER — Other Ambulatory Visit: Payer: Self-pay

## 2019-01-16 DIAGNOSIS — M810 Age-related osteoporosis without current pathological fracture: Secondary | ICD-10-CM

## 2019-01-16 DIAGNOSIS — Z13220 Encounter for screening for lipoid disorders: Secondary | ICD-10-CM | POA: Diagnosis not present

## 2019-01-16 DIAGNOSIS — D649 Anemia, unspecified: Secondary | ICD-10-CM

## 2019-01-16 DIAGNOSIS — Z87898 Personal history of other specified conditions: Secondary | ICD-10-CM

## 2019-01-16 LAB — CBC WITH DIFFERENTIAL/PLATELET
Basophils Absolute: 0.1 10*3/uL (ref 0.0–0.1)
Basophils Relative: 1.2 % (ref 0.0–3.0)
Eosinophils Absolute: 0.1 10*3/uL (ref 0.0–0.7)
Eosinophils Relative: 1.1 % (ref 0.0–5.0)
HCT: 37.7 % (ref 36.0–46.0)
Hemoglobin: 12.6 g/dL (ref 12.0–15.0)
Lymphocytes Relative: 22.1 % (ref 12.0–46.0)
Lymphs Abs: 1.6 10*3/uL (ref 0.7–4.0)
MCHC: 33.6 g/dL (ref 30.0–36.0)
MCV: 93.6 fl (ref 78.0–100.0)
Monocytes Absolute: 0.5 10*3/uL (ref 0.1–1.0)
Monocytes Relative: 6.1 % (ref 3.0–12.0)
Neutro Abs: 5.1 10*3/uL (ref 1.4–7.7)
Neutrophils Relative %: 69.5 % (ref 43.0–77.0)
Platelets: 285 10*3/uL (ref 150.0–400.0)
RBC: 4.02 Mil/uL (ref 3.87–5.11)
RDW: 12.7 % (ref 11.5–15.5)
WBC: 7.4 10*3/uL (ref 4.0–10.5)

## 2019-01-16 LAB — COMPREHENSIVE METABOLIC PANEL
ALT: 15 U/L (ref 0–35)
AST: 22 U/L (ref 0–37)
Albumin: 4.3 g/dL (ref 3.5–5.2)
Alkaline Phosphatase: 86 U/L (ref 39–117)
BUN: 13 mg/dL (ref 6–23)
CO2: 30 mEq/L (ref 19–32)
Calcium: 10.1 mg/dL (ref 8.4–10.5)
Chloride: 102 mEq/L (ref 96–112)
Creatinine, Ser: 0.76 mg/dL (ref 0.40–1.20)
GFR: 76.18 mL/min (ref >60.00)
Glucose, Bld: 96 mg/dL (ref 70–99)
Potassium: 4 mEq/L (ref 3.5–5.1)
Sodium: 139 mEq/L (ref 135–145)
Total Bilirubin: 0.7 mg/dL (ref 0.2–1.2)
Total Protein: 7 g/dL (ref 6.0–8.3)

## 2019-01-16 LAB — VITAMIN D 25 HYDROXY (VIT D DEFICIENCY, FRACTURES): VITD: 32.43 ng/mL (ref 30.00–100.00)

## 2019-01-16 LAB — LIPID PANEL
Cholesterol: 228 mg/dL — ABNORMAL HIGH (ref 0–200)
HDL: 99.6 mg/dL (ref >39.00)
LDL Cholesterol: 114 mg/dL — ABNORMAL HIGH (ref 0–99)
NonHDL: 128.28
Total CHOL/HDL Ratio: 2
Triglycerides: 71 mg/dL (ref 0.0–149.0)
VLDL: 14.2 mg/dL (ref 0.0–40.0)

## 2019-01-16 LAB — TSH: TSH: 1.19 u[IU]/mL (ref 0.35–4.50)

## 2019-01-16 NOTE — Telephone Encounter (Signed)
Orders placed for labs

## 2019-01-16 NOTE — Telephone Encounter (Signed)
Pt came in for labs this morning with no orders. Please place future orders

## 2019-01-16 NOTE — Addendum Note (Signed)
Addended by: Leeanne Rio on: 01/16/2019 08:18 AM   Modules accepted: Orders

## 2019-01-20 ENCOUNTER — Encounter: Payer: Self-pay | Admitting: Internal Medicine

## 2019-01-20 ENCOUNTER — Ambulatory Visit (INDEPENDENT_AMBULATORY_CARE_PROVIDER_SITE_OTHER): Payer: Medicare Other | Admitting: Internal Medicine

## 2019-01-20 ENCOUNTER — Other Ambulatory Visit: Payer: Self-pay

## 2019-01-20 VITALS — BP 112/60 | HR 75 | Temp 97.7°F | Ht 67.72 in | Wt 124.0 lb

## 2019-01-20 DIAGNOSIS — Z1239 Encounter for other screening for malignant neoplasm of breast: Secondary | ICD-10-CM

## 2019-01-20 DIAGNOSIS — E2839 Other primary ovarian failure: Secondary | ICD-10-CM

## 2019-01-20 DIAGNOSIS — Z0000 Encounter for general adult medical examination without abnormal findings: Secondary | ICD-10-CM | POA: Diagnosis not present

## 2019-01-20 DIAGNOSIS — Z1231 Encounter for screening mammogram for malignant neoplasm of breast: Secondary | ICD-10-CM

## 2019-01-20 DIAGNOSIS — Z9109 Other allergy status, other than to drugs and biological substances: Secondary | ICD-10-CM

## 2019-01-20 DIAGNOSIS — M810 Age-related osteoporosis without current pathological fracture: Secondary | ICD-10-CM

## 2019-01-20 DIAGNOSIS — Z13220 Encounter for screening for lipoid disorders: Secondary | ICD-10-CM

## 2019-01-20 NOTE — Assessment & Plan Note (Addendum)
Physical today 01/20/19.  PAP 01/08/17 - negative with negative HPV.  Mammogram 02/21/18 - Birads I.  Discussed due colonoscopy.

## 2019-01-20 NOTE — Progress Notes (Signed)
Patient ID: Hannah Hart, female   DOB: [birth year], 68 y.o.   MRN: [id number]   Subjective:    Patient ID: Hannah Hart, female    DOB: 1950/12/18, 68 y.o.   MRN: [id number]  HPI  Patient here for her physical exam.  She reports she is doing well.  Feels good.  Still increased stress related to her brother's health issues.  She feels she is handling things relatively well.  Stays active.  No chest pain.  No sob.  No acid reflux.  No abdominal pain.  Bowels moving.     Past Medical History:  Diagnosis Date  . Environmental allergies   . History of abnormal Pap smear   . History of chicken pox   . Uterine fibroid    Past Surgical History:  Procedure Laterality Date  . s/p intervention for uterine fibroids     Family History  Problem Relation Age of Onset  . Hypertension Mother   . Atrial fibrillation Mother   . Lung cancer Father        died age 85  . Cancer Maternal Uncle        mouth and throat cancer  . Heart disease Unknown        aunt  . CVA Unknown        aunt  . Heart disease Maternal Aunt        s/p CABG   Social History   Socioeconomic History  . Marital status: Married    Spouse name: Not on file  . Number of children: Not on file  . Years of education: Not on file  . Highest education level: Not on file  Occupational History  . Not on file  Social Needs  . Financial resource strain: Not on file  . Food insecurity    Worry: Not on file    Inability: Not on file  . Transportation needs    Medical: Not on file    Non-medical: Not on file  Tobacco Use  . Smoking status: Never Smoker  . Smokeless tobacco: Never Used  Substance and Sexual Activity  . Alcohol use: No    Alcohol/week: 0.0 standard drinks  . Drug use: No  . Sexual activity: Not on file  Lifestyle  . Physical activity    Days per week: Not on file    Minutes per session: Not on file  . Stress: Not on file  Relationships  . Social Herbalist on phone: Not on file    Gets  together: Not on file    Attends religious service: Not on file    Active member of club or organization: Not on file    Attends meetings of clubs or organizations: Not on file    Relationship status: Not on file  Other Topics Concern  . Not on file  Social History Narrative  . Not on file    Outpatient Encounter Medications as of 01/20/2019  Medication Sig  . fexofenadine (ALLEGRA) 180 MG tablet Take 180 mg by mouth as needed.  . fluticasone (FLONASE) 50 MCG/ACT nasal spray Place 2 sprays into the nose as needed for rhinitis.  Marland Kitchen ibuprofen (ADVIL,MOTRIN) 200 MG tablet Take 200 mg by mouth every 6 (six) hours as needed for pain.  . Multiple Vitamins-Minerals (ALIVE ENERGY 50+ PO) Take by mouth.  . mupirocin ointment (BACTROBAN) 2 % Place 1 application into the nose 2 (two) times daily.   No facility-administered encounter medications on file as of  01/20/2019.     Review of Systems  Constitutional: Negative for appetite change and unexpected weight change.  HENT: Negative for congestion and sinus pressure.   Eyes: Negative for pain and visual disturbance.  Respiratory: Negative for cough, chest tightness and shortness of breath.   Cardiovascular: Negative for chest pain, palpitations and leg swelling.  Gastrointestinal: Negative for abdominal pain, diarrhea, nausea and vomiting.  Genitourinary: Negative for difficulty urinating and dysuria.  Musculoskeletal: Negative for joint swelling and myalgias.  Skin: Negative for color change and rash.  Neurological: Negative for dizziness, light-headedness and headaches.  Hematological: Negative for adenopathy. Does not bruise/bleed easily.  Psychiatric/Behavioral: Negative for agitation and dysphoric mood.       Objective:    Physical Exam Constitutional:      General: She is not in acute distress.    Appearance: Normal appearance. She is well-developed.  HENT:     Right Ear: External ear normal.     Left Ear: External ear normal.   Eyes:     General: No scleral icterus.       Right eye: No discharge.        Left eye: No discharge.     Conjunctiva/sclera: Conjunctivae normal.  Neck:     Musculoskeletal: Neck supple. No muscular tenderness.     Thyroid: No thyromegaly.  Cardiovascular:     Rate and Rhythm: Normal rate and regular rhythm.  Pulmonary:     Effort: No tachypnea, accessory muscle usage or respiratory distress.     Breath sounds: Normal breath sounds. No decreased breath sounds or wheezing.  Chest:     Breasts:        Right: No inverted nipple, mass, nipple discharge or tenderness (no axillary adenopathy).        Left: No inverted nipple, mass, nipple discharge or tenderness (no axilarry adenopathy).  Abdominal:     General: Bowel sounds are normal.     Palpations: Abdomen is soft.     Tenderness: There is no abdominal tenderness.  Musculoskeletal:        General: No swelling or tenderness.  Lymphadenopathy:     Cervical: No cervical adenopathy.  Skin:    Findings: No erythema or rash.  Neurological:     Mental Status: She is alert and oriented to person, place, and time.  Psychiatric:        Mood and Affect: Mood normal.        Behavior: Behavior normal.     BP 112/60 (BP Location: Left Arm, Patient Position: Sitting, Cuff Size: Normal)   Pulse 75   Temp 97.7 F (36.5 C) (Temporal)   Ht 5' 7.72" (1.72 m)   Wt 124 lb (56.2 kg)   SpO2 98%   BMI 19.01 kg/m  Wt Readings from Last 3 Encounters:  01/20/19 124 lb (56.2 kg)  01/14/18 118 lb 6.4 oz (53.7 kg)  01/08/17 114 lb 12.8 oz (52.1 kg)     Lab Results  Component Value Date   WBC 7.4 01/16/2019   HGB 12.6 01/16/2019   HCT 37.7 01/16/2019   PLT 285.0 01/16/2019   GLUCOSE 96 01/16/2019   CHOL 228 (H) 01/16/2019   TRIG 71.0 01/16/2019   HDL 99.60 01/16/2019   LDLDIRECT 99.2 12/26/2012   LDLCALC 114 (H) 01/16/2019   ALT 15 01/16/2019   AST 22 01/16/2019   NA 139 01/16/2019   K 4.0 01/16/2019   CL 102 01/16/2019    CREATININE 0.76 01/16/2019   BUN 13 01/16/2019  CO2 30 01/16/2019   TSH 1.19 01/16/2019    Mm 3d Screen Breast Bilateral  Result Date: 02/21/2018 CLINICAL DATA:  Screening. EXAM: DIGITAL SCREENING BILATERAL MAMMOGRAM WITH TOMO AND CAD COMPARISON:  Previous exam(s). ACR Breast Density Category c: The breast tissue is heterogeneously dense, which may obscure small masses. FINDINGS: There are no findings suspicious for malignancy. Images were processed with CAD. IMPRESSION: No mammographic evidence of malignancy. A result letter of this screening mammogram will be mailed directly to the patient. RECOMMENDATION: Screening mammogram in one year. (Code:SM-B-01Y) BI-RADS CATEGORY  1: Negative. Electronically Signed   By: Everlean Alstrom M.D.   On: 02/21/2018 08:11       Assessment & Plan:   Problem List Items Addressed This Visit    Environmental allergies    Controlled.       Relevant Orders   CBC with Differential/Platelet   Health care maintenance    Physical today 01/20/19.  PAP 01/08/17 - negative with negative HPV.  Mammogram 02/21/18 - Birads I.  Discussed due colonoscopy.        Osteoporosis    Had desired no prescription medication.  Schedule f/u bone density.        Relevant Orders   Comprehensive metabolic panel    Other Visit Diagnoses    Breast cancer screening    -  Primary   Encounter for screening mammogram for breast cancer       Relevant Orders   MM 3D SCREEN BREAST BILATERAL   Estrogen deficiency       Relevant Orders   DG Bone Density   TSH   Screening cholesterol level       Relevant Orders   Lipid panel       Einar Pheasant, MD

## 2019-01-25 NOTE — Assessment & Plan Note (Signed)
Had desired no prescription medication.  Schedule f/u bone density.

## 2019-01-25 NOTE — Assessment & Plan Note (Signed)
Controlled.  

## 2019-03-06 ENCOUNTER — Ambulatory Visit
Admission: RE | Admit: 2019-03-06 | Discharge: 2019-03-06 | Disposition: A | Discharge status: Home / Self Care | Payer: Medicare Other | Source: Ambulatory Visit | Attending: Internal Medicine | Admitting: Internal Medicine

## 2019-03-06 DIAGNOSIS — E2839 Other primary ovarian failure: Secondary | ICD-10-CM | POA: Diagnosis not present

## 2019-03-06 DIAGNOSIS — Z1231 Encounter for screening mammogram for malignant neoplasm of breast: Secondary | ICD-10-CM | POA: Insufficient documentation

## 2019-03-12 ENCOUNTER — Ambulatory Visit (INDEPENDENT_AMBULATORY_CARE_PROVIDER_SITE_OTHER): Payer: Medicare Other | Admitting: Internal Medicine

## 2019-03-12 ENCOUNTER — Encounter: Payer: Self-pay | Admitting: Internal Medicine

## 2019-03-12 ENCOUNTER — Other Ambulatory Visit: Payer: Self-pay

## 2019-03-12 DIAGNOSIS — M810 Age-related osteoporosis without current pathological fracture: Secondary | ICD-10-CM | POA: Diagnosis not present

## 2019-03-12 NOTE — Progress Notes (Signed)
Patient ID: Hannah Hart, female   DOB: 03-24-1951, 68 y.o.   MRN: [id number]   Subjective:    Patient ID: Hannah Hart, female    DOB: Jan 04, 1951, 68 y.o.   MRN: [id number]  HPI  Patient here for a scheduled follow to discuss her bone density and treatment options.  She reports she is doing well.  Stays active.  Handling stress.  Discussed bone density results.  Discussed osteoporosis.  Discussed treatment options, including reclast, oral bisphosphonates, miacalcin, evista and prolia.  Discussed possible side effects of medication.  Discussed taking calcium, vitamin D and discussed weight bearing exercise.  No acid reflux.  No problems swallowing.  No chest pain or sob.  Overall she feels good.    Past Medical History:  Diagnosis Date  . Environmental allergies   . History of abnormal Pap smear   . History of chicken pox   . Uterine fibroid    Past Surgical History:  Procedure Laterality Date  . s/p intervention for uterine fibroids     Family History  Problem Relation Age of Onset  . Hypertension Mother   . Atrial fibrillation Mother   . Lung cancer Father        died age 50  . Cancer Maternal Uncle        mouth and throat cancer  . Heart disease Other        aunt  . CVA Other        aunt  . Heart disease Maternal Aunt        s/p CABG   Social History   Socioeconomic History  . Marital status: Married    Spouse name: Not on file  . Number of children: Not on file  . Years of education: Not on file  . Highest education level: Not on file  Occupational History  . Not on file  Social Needs  . Financial resource strain: Not on file  . Food insecurity    Worry: Not on file    Inability: Not on file  . Transportation needs    Medical: Not on file    Non-medical: Not on file  Tobacco Use  . Smoking status: Never Smoker  . Smokeless tobacco: Never Used  Substance and Sexual Activity  . Alcohol use: No    Alcohol/week: 0.0 standard drinks  . Drug use: No  .  Sexual activity: Not on file  Lifestyle  . Physical activity    Days per week: Not on file    Minutes per session: Not on file  . Stress: Not on file  Relationships  . Social Herbalist on phone: Not on file    Gets together: Not on file    Attends religious service: Not on file    Active member of club or organization: Not on file    Attends meetings of clubs or organizations: Not on file    Relationship status: Not on file  Other Topics Concern  . Not on file  Social History Narrative  . Not on file    Outpatient Encounter Medications as of 03/12/2019  Medication Sig  . fexofenadine (ALLEGRA) 180 MG tablet Take 180 mg by mouth as needed.  . fluticasone (FLONASE) 50 MCG/ACT nasal spray Place 2 sprays into the nose as needed for rhinitis.  Marland Kitchen ibuprofen (ADVIL,MOTRIN) 200 MG tablet Take 200 mg by mouth every 6 (six) hours as needed for pain.  . Multiple Vitamins-Minerals (ALIVE ENERGY 50+ PO) Take by mouth.  Marland Kitchen  mupirocin ointment (BACTROBAN) 2 % Place 1 application into the nose 2 (two) times daily.   No facility-administered encounter medications on file as of 03/12/2019.    Review of Systems  Constitutional: Negative for appetite change and unexpected weight change.  HENT: Negative for congestion and sinus pressure.   Respiratory: Negative for cough, chest tightness and shortness of breath.   Cardiovascular: Negative for chest pain and leg swelling.  Gastrointestinal: Negative for abdominal pain, diarrhea, nausea and vomiting.  Musculoskeletal: Negative for joint swelling and myalgias.  Skin: Negative for color change and rash.  Neurological: Negative for dizziness and headaches.  Psychiatric/Behavioral: Negative for agitation and dysphoric mood.       Objective:    Physical Exam Constitutional:      General: She is not in acute distress.    Appearance: Normal appearance.  HENT:     Head: Normocephalic and atraumatic.     Right Ear: External ear normal.      Left Ear: External ear normal.  Neck:     Musculoskeletal: Neck supple. No muscular tenderness.     Thyroid: No thyromegaly.  Cardiovascular:     Rate and Rhythm: Normal rate and regular rhythm.  Pulmonary:     Effort: No respiratory distress.     Breath sounds: Normal breath sounds. No wheezing.  Abdominal:     General: Bowel sounds are normal.     Palpations: Abdomen is soft.     Tenderness: There is no abdominal tenderness.  Musculoskeletal:        General: No swelling or tenderness.  Lymphadenopathy:     Cervical: No cervical adenopathy.  Neurological:     Mental Status: She is alert.  Psychiatric:        Mood and Affect: Mood normal.        Behavior: Behavior normal.     BP 138/70   Pulse 83   Resp 16   Wt 122 lb 12.8 oz (55.7 kg)   SpO2 98%   BMI 18.83 kg/m  Wt Readings from Last 3 Encounters:  03/12/19 122 lb 12.8 oz (55.7 kg)  01/20/19 124 lb (56.2 kg)  01/14/18 118 lb 6.4 oz (53.7 kg)     Lab Results  Component Value Date   WBC 7.4 01/16/2019   HGB 12.6 01/16/2019   HCT 37.7 01/16/2019   PLT 285.0 01/16/2019   GLUCOSE 96 01/16/2019   CHOL 228 (H) 01/16/2019   TRIG 71.0 01/16/2019   HDL 99.60 01/16/2019   LDLDIRECT 99.2 12/26/2012   LDLCALC 114 (H) 01/16/2019   ALT 15 01/16/2019   AST 22 01/16/2019   NA 139 01/16/2019   K 4.0 01/16/2019   CL 102 01/16/2019   CREATININE 0.76 01/16/2019   BUN 13 01/16/2019   CO2 30 01/16/2019   TSH 1.19 01/16/2019    Dg Bone Density  Result Date: 03/06/2019 EXAM: DUAL X-RAY ABSORPTIOMETRY (DXA) FOR BONE MINERAL DENSITY IMPRESSION: Technologist: SCE PATIENT BIOGRAPHICAL: Name: Hannah, Hart Patient ID: [id number] Birth Date: 12/11/1950 Height: 67.7 in. Gender: Female Exam Date: 03/06/2019 Weight: 122.4 lbs. Indications: Postmenopausal, Caucasian Fractures: Treatments: Multi-Vitamin, Vitamin D ASSESSMENT: The BMD measured at AP Spine L1-L4 is 0.831 g/cm2 with a T-score of -2.9. This patient is considered  osteoporotic according to [facility]) criteria. The scan quality is good. Site Region Measured Measured WHO Young Adult BMD Date       Age      Classification T-score AP Spine L1-L4 03/06/2019 65.9 Osteoporosis -2.9 0.831  g/cm2 DualFemur Neck Right 03/06/2019 65.9 Osteoporosis -2.5 0.696 g/cm2 DualFemur Neck Right 02/19/2017 63.9 Osteopenia -2.3 0.723 g/cm2 DualFemur Total Mean 03/06/2019 65.9 Osteopenia -2.2 0.736 g/cm2 DualFemur Total Mean 02/19/2017 63.9 Osteopenia -2.1 0.744 g/cm2 World Health Organization [facility]) criteria for post-menopausal, Caucasian Women: Normal:       T-score at or above -1 SD Osteopenia:   T-score between -1 and -2.5 SD Osteoporosis: T-score at or below -2.5 SD RECOMMENDATIONS: 1. All patients should optimize calcium and vitamin D intake. 2. Consider FDA-approved medical therapies in postmenopausal women and men aged 18 years and older, based on the following: a. A hip or vertebral(clinical or morphometric) fracture b. T-score < -2.5 at the femoral neck or spine after appropriate evaluation to exclude secondary causes c. Low bone mass (T-score between -1.0 and -2.5 at the femoral neck or spine) and a 10-year probability of a hip fracture > 3% or a 10-year probability of a major osteoporosis-related fracture > 20% based on the [place]-adapted WHO algorithm d. Clinician judgment and/or patient preferences may indicate treatment for people with 10-year fracture probabilities above or below these levels FOLLOW-UP: People with diagnosed cases of osteoporosis or at high risk for fracture should have regular bone mineral density tests. For patients eligible for Medicare, routine testing is allowed once every 2 years. The testing frequency can be increased to one year for patients who have rapidly progressing disease, those who are receiving or discontinuing medical therapy to restore bone mass, or have additional risk factors. I have reviewed this report, and agree with the above  findings. [facility] Electronically Signed   By: Lowella Grip III M.D.   On: 03/06/2019 13:56   Mm 3d Screen Breast Bilateral  Result Date: 03/07/2019 CLINICAL DATA:  Screening. EXAM: DIGITAL SCREENING BILATERAL MAMMOGRAM WITH TOMO AND CAD COMPARISON:  Previous exam(s). ACR Breast Density Category c: The breast tissue is heterogeneously dense, which may obscure small masses. FINDINGS: There are no findings suspicious for malignancy. Images were processed with CAD. IMPRESSION: No mammographic evidence of malignancy. A result letter of this screening mammogram will be mailed directly to the patient. RECOMMENDATION: Screening mammogram in one year. (Code:SM-B-01Y) BI-RADS CATEGORY  1: Negative. Electronically Signed   By: Curlene Dolphin M.D.   On: 03/07/2019 11:10       Assessment & Plan:   Problem List Items Addressed This Visit    Osteoporosis    Discussed recent bone density results and osteoporosis.  Discussed treatment options as outlined.  She declined prescription medication.  Discussed calcium, vitamin D and weight bearing exercise.  Follow.  Will notify me if changes her mind.            Einar Pheasant, MD

## 2019-03-16 ENCOUNTER — Encounter: Payer: Self-pay | Admitting: Internal Medicine

## 2019-03-16 NOTE — Assessment & Plan Note (Signed)
Discussed recent bone density results and osteoporosis.  Discussed treatment options as outlined.  She declined prescription medication.  Discussed calcium, vitamin D and weight bearing exercise.  Follow.  Will notify me if changes her mind.

## 2019-05-20 ENCOUNTER — Telehealth: Payer: Self-pay | Admitting: Internal Medicine

## 2019-05-20 NOTE — Telephone Encounter (Signed)
Left message for patient to call back and schedule Medicare Annual Wellness Visit (AWV) either virtually/audio only.  No hx; please schedule at anytime with Denisa O'Brien-Blaney at [facility] to schedule--Started Part B in 03/2018

## 2019-09-25 ENCOUNTER — Other Ambulatory Visit: Payer: Self-pay

## 2019-09-26 ENCOUNTER — Encounter: Payer: Self-pay | Admitting: Nurse Practitioner

## 2019-09-26 ENCOUNTER — Ambulatory Visit (INDEPENDENT_AMBULATORY_CARE_PROVIDER_SITE_OTHER): Payer: Medicare Other | Admitting: Nurse Practitioner

## 2019-09-26 VITALS — BP 132/70 | HR 80 | Temp 97.2°F | Ht 67.2 in | Wt 125.4 lb

## 2019-09-26 DIAGNOSIS — S80261A Insect bite (nonvenomous), right knee, initial encounter: Secondary | ICD-10-CM

## 2019-09-26 DIAGNOSIS — W57XXXA Bitten or stung by nonvenomous insect and other nonvenomous arthropods, initial encounter: Secondary | ICD-10-CM

## 2019-09-26 MED ORDER — DOXYCYCLINE HYCLATE 100 MG PO TABS: 100.0000 mg | ORAL_TABLET | Freq: Two times a day (BID) | ORAL | 0 refills | Status: AC

## 2019-09-26 NOTE — Patient Instructions (Addendum)
It was nice to see you today.   Please go to the lab for Lyme and RMSF testing.  I have ordered an antibiotic for cellulitis. Monitor the site closely and call if redness continues. Monitor for fever/chills/headache/today ache and seek medical evaluation if this develops.   Office visit in 1 week if tick bite site has not improved.     Tick Bite Information, Adult  Ticks are insects that draw blood for food. Most ticks live in shrubs and grassy areas. They climb onto people and animals that brush against the leaves and grasses that they rest on. Then they bite, attaching themselves to the skin. Most ticks are harmless, but some ticks carry germs that can spread to a person through a bite and cause a disease. To reduce your risk of getting a disease from a tick bite, it is important to take steps to prevent tick bites. It is also important to check for ticks after being outdoors. If you find that a tick has attached to you, watch for symptoms of disease. How can I prevent tick bites? Take these steps to help prevent tick bites when you are outdoors in an area where ticks are found:  Use insect repellent that has DEET (20% or higher), picaridin, or IR3535 in it. Use it on: ? Skin that is showing. ? The top of your boots. ? Your pant legs. ? Your sleeve cuffs.  For repellent products that contain permethrin, follow product instructions. Use these products on: ? Clothing. ? Gear. ? Boots. ? Tents.  Wear protective clothing. Long sleeves and long pants offer the best protection from ticks.  Wear light-colored clothing so you can see ticks more easily.  Tuck your pant legs into your socks.  If you go walking on a trail, stay in the middle of the trail so your skin, hair, and clothing do not touch the bushes.  Avoid walking through areas with long grass.  Check for ticks on your clothing, hair, and skin often while you are outside, and check again before you go inside. Make sure to  check the places that ticks attach themselves most often. These places include the scalp, neck, armpits, waist, groin, and joint areas. Ticks that carry a disease called Lyme disease have to be attached to the skin for 24-48 hours. Checking for ticks every day will lessen your risk of this and other diseases.  When you come indoors, wash your clothes and take a shower or a bath right away. Dry your clothes in a dryer on high heat for at least 60 minutes. This will kill any ticks in your clothes. What is the proper way to remove a tick? If you find a tick on your body, remove it as soon as possible. Removing a tick sooner rather than later can prevent germs from passing from the tick to your body. To remove a tick that is crawling on your skin but has not bitten:  Go outdoors and brush the tick off.  Remove the tick with tape or a lint roller. To remove a tick that is attached to your skin:  Wash your hands.  If you have latex gloves, put them on.  Use tweezers, curved forceps, or a tick-removal tool to gently grasp the tick as close to your skin and the tick's head as possible.  Gently pull with steady, upward pressure until the tick lets go. When removing the tick: ? Take care to keep the tick's head attached to its body. ?  Do not twist or jerk the tick. This can make the tick's head or mouth break off. ? Do not squeeze or crush the tick's body. This could force disease-carrying fluids from the tick into your body. Do not try to remove a tick with heat, alcohol, petroleum jelly, or fingernail polish. Using these methods can cause the tick to salivate and regurgitate into your bloodstream, increasing your risk of getting a disease. What should I do after removing a tick?  Clean the bite area with soap and water, rubbing alcohol, or an iodine scrub.  If an antiseptic cream or ointment is available, apply a small amount to the bite site.  Wash and disinfect any instruments that you used to  remove the tick. How should I dispose of a tick? To dispose of a live tick, use one of these methods:  Place it in rubbing alcohol.  Place it in a sealed bag or container.  Wrap it tightly in tape.  Flush it down the toilet. Contact a health care provider if:  You have symptoms of a disease after a tick bite. Symptoms of a tick-borne disease can occur from moments after the tick bites to up to 30 days after a tick is removed. Symptoms include: ? Muscle, joint, or bone pain. ? Difficulty walking or moving your legs. ? Numbness in the legs. ? Paralysis. ? Red rash around the tick bite area that is shaped like a target or a "bull's-eye." ? Redness and swelling in the area of the tick bite. ? Fever. ? Repeated vomiting. ? Diarrhea. ? Weight loss. ? Tender, swollen lymph glands. ? Shortness of breath. ? Cough. ? Pain in the abdomen. ? Headache. ? Abnormal tiredness. ? A change in your level of consciousness. ? Confusion. Get help right away if:  You are not able to remove a tick.  A part of a tick breaks off and gets stuck in your skin.  Your symptoms get worse. Summary  Ticks may carry germs that can spread to a person through a bite and cause disease.  Wear protective clothing and use insect repellent to prevent tick bites. Follow product instructions.  If you find a tick on your body, remove it as soon as possible. If the tick is attached, do not try to remove with heat, alcohol, petroleum jelly, or fingernail polish.  Remove the attached tick using tweezers, curved forceps, or a tick-removal tool. Gently pull with steady, upward pressure until the tick lets go. Do not twist or jerk the tick. Do not squeeze or crush the tick's body.  If you have symptoms after being bitten by a tick, contact a health care provider. This information is not intended to replace advice given to you by your health care provider. Make sure you discuss any questions you have with your health  care provider. Document Revised: 03/30/2017 Document Reviewed: 01/28/2016 Elsevier Patient Education  2020 [employer].

## 2019-09-26 NOTE — Progress Notes (Signed)
Subjective:  Patient ID: Hannah Hart, female    DOB: [birth year]  Age: 69 y.o. MRN: [id number]  CC:  Chief Complaint  Patient presents with  . Acute Visit    tick bite    HPI Hannah Hart presents for a tick bite that has become red and had a dark rim around it. 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 She came in and took a shower. She saw a tick  stuck on below her knee. She had to tug hard to remove it. She did not have to dig it out and believes it came out in one piece. It was not a [place] tick. It was not engorged. She does not think it was on her longer than 3 hours. She saw 2 other ticks crawling around. Yesterday, she saw the tick bite looked swollen-like a puffy mosquito bite, was pink and had a red rim around the bump. Positive very itchy. No fever/chills/HA or body aches. No past medical history of tick born illness. She has a dog in the house. She has pulled multiple ticks off of her in the past.   Past Medical History:  Diagnosis Date  . Environmental allergies   . History of abnormal Pap smear   . History of chicken pox   . Uterine fibroid     Past Surgical History:  Procedure Laterality Date  . s/p intervention for uterine fibroids      Family History  Problem Relation Age of Onset  . Hypertension Mother   . Atrial fibrillation Mother   . Lung cancer Father        died age 76  . Cancer Maternal Uncle        mouth and throat cancer  . Heart disease Other        aunt  . CVA Other        aunt  . Heart disease Maternal Aunt        s/p CABG    Social History   Socioeconomic History  . Marital status: Married    Spouse name: Not on file  . Number of children: Not on file  . Years of education: Not on file  . Highest education level: Not on file  Occupational History  . Not on file  Tobacco Use  . Smoking status: Never Smoker  . Smokeless tobacco: Never Used  Substance and Sexual Activity  . Alcohol use: No    Alcohol/week: 0.0  standard drinks  . Drug use: No  . Sexual activity: Not on file  Other Topics Concern  . Not on file  Social History Narrative  . Not on file   Social Determinants of Health   Financial Resource Strain:   . Difficulty of Paying Living Expenses:   Food Insecurity:   . Worried About Charity fundraiser in the Last Year:   . Arboriculturist in the Last Year:   Transportation Needs:   . Film/video editor (Medical):   Marland Kitchen Lack of Transportation (Non-Medical):   Physical Activity:   . Days of Exercise per Week:   . Minutes of Exercise per Session:   Stress:   . Feeling of Stress :   Social Connections:   . Frequency of Communication with Friends and Family:   . Frequency of Social Gatherings with Friends and Family:   . Attends Religious Services:   . Active Member of Clubs or Organizations:   . Attends Club or  Organization Meetings:   Marland Kitchen Marital Status:   Intimate Partner Violence:   . Fear of Current or Ex-Partner:   . Emotionally Abused:   Marland Kitchen Physically Abused:   . Sexually Abused:     Outpatient Medications Prior to Visit  Medication Sig Dispense Refill  . fexofenadine (ALLEGRA) 180 MG tablet Take 180 mg by mouth as needed.    . fluocinonide cream (LIDEX) 0.05 % APPLY TO RASH TWICE DAILY AS NEEDED    . fluticasone (FLONASE) 50 MCG/ACT nasal spray Place 2 sprays into the nose as needed for rhinitis.    Marland Kitchen ibuprofen (ADVIL,MOTRIN) 200 MG tablet Take 200 mg by mouth every 6 (six) hours as needed for pain.    . Multiple Vitamins-Minerals (ALIVE ENERGY 50+ PO) Take by mouth.    . mupirocin ointment (BACTROBAN) 2 % Place 1 application into the nose 2 (two) times daily. 22 g 0   No facility-administered medications prior to visit.    Allergies  Allergen Reactions  . Propofol Nausea Only    ROS Pertinent positives numbness with history of present illness.    Objective:    Physical Exam  Skin:  Tick bite site red, swollen blister like right lower knee   inferior/lateal to the knee.      BP 132/70 (BP Location: Left Arm, Patient Position: Sitting, Cuff Size: Normal)   Pulse 80   Temp (!) 97.2 F (36.2 C) (Skin)   Ht 5' 7.2" (1.707 m)   Wt 125 lb 6.4 oz (56.9 kg)   SpO2 98%   BMI 19.52 kg/m  Wt Readings from Last 3 Encounters:  09/26/19 125 lb 6.4 oz (56.9 kg)  03/12/19 122 lb 12.8 oz (55.7 kg)  01/20/19 124 lb (56.2 kg)     Health Maintenance Due  Topic Date Due  . Hepatitis C Screening  Never done  . COVID-19 Vaccine (1) Never done  . COLONOSCOPY  Never done  . PNA vac Low Risk Adult (1 of 2 - PCV13) Never done    There are no preventive care reminders to display for this patient.  Lab Results  Component Value Date   TSH 1.19 01/16/2019   Lab Results  Component Value Date   WBC 7.4 01/16/2019   HGB 12.6 01/16/2019   HCT 37.7 01/16/2019   MCV 93.6 01/16/2019   PLT 285.0 01/16/2019   Lab Results  Component Value Date   NA 139 01/16/2019   K 4.0 01/16/2019   CO2 30 01/16/2019   GLUCOSE 96 01/16/2019   BUN 13 01/16/2019   CREATININE 0.76 01/16/2019   BILITOT 0.7 01/16/2019   ALKPHOS 86 01/16/2019   AST 22 01/16/2019   ALT 15 01/16/2019   PROT 7.0 01/16/2019   ALBUMIN 4.3 01/16/2019   CALCIUM 10.1 01/16/2019   GFR 76.18 01/16/2019   Lab Results  Component Value Date   CHOL 228 (H) 01/16/2019   Lab Results  Component Value Date   HDL 99.60 01/16/2019   Lab Results  Component Value Date   LDLCALC 114 (H) 01/16/2019   Lab Results  Component Value Date   TRIG 71.0 01/16/2019   Lab Results  Component Value Date   CHOLHDL 2 01/16/2019   No results found for: HGBA1C    Assessment & Plan:   Problem List Items Addressed This Visit    None    Visit Diagnoses    Tick bite, initial encounter    -  Primary   Relevant Orders   Lyme  Ab (IgG/M) + RMSF( IgG/M)      Meds ordered this encounter  Medications  . doxycycline (VIBRA-TABS) 100 MG tablet    Sig: Take 1 tablet (100 mg total) by mouth  2 (two) times daily for 7 days.    Dispense:  14 tablet    Refill:  0    Order Specific Question:   Supervising Provider    Answer:   Einar Pheasant [id number]   Please go to the lab for Lyme and RMSF testing.  Tick bite is very itchy and inflamed. Slight pink on the skin inferior to the bite. I do not see the bullseye rash. I think she has an allergic reaction to the bite and  have ordered an antibiotic for treatment of possible early cellulitis. Monitor the site closely and call if redness continues. Monitor for fever/chills/headache/today ache and seek medical evaluation if this develops.   Office visit in 1 week if tick bite site has not improved.     Follow-up: Return if symptoms worsen or fail to improve.   This visit occurred during the SARS-CoV-2 public health emergency.  Safety protocols were in place, including screening questions prior to the visit, additional usage of staff PPE, and extensive cleaning of exam room while observing appropriate contact time as indicated for disinfecting solutions.    Denice Paradise, NP

## 2019-10-01 ENCOUNTER — Telehealth: Payer: Self-pay | Admitting: Internal Medicine

## 2019-10-01 ENCOUNTER — Telehealth: Payer: Self-pay | Admitting: Nurse Practitioner

## 2019-10-01 DIAGNOSIS — W57XXXD Bitten or stung by nonvenomous insect and other nonvenomous arthropods, subsequent encounter: Secondary | ICD-10-CM

## 2019-10-01 LAB — RMSF, IGG, IFA: RMSF, IGG, IFA: <1:64 {titer}

## 2019-10-01 LAB — LYMEAB(IGG/M)+RMSF(IGG/M)
LYME DISEASE AB, QUANT, IGM: <0.8 index (ref 0.00–0.79)
Lyme IgG/IgM Ab: <0.91 {ISR} (ref 0.00–0.90)
RMSF IgG: POSITIVE — AB
RMSF IgM: 0.2 index (ref 0.00–0.89)

## 2019-10-01 NOTE — Telephone Encounter (Signed)
Patient called about lab results; which have not been sent to me yet. I can call her as soon as they come back to me.

## 2019-10-01 NOTE — Telephone Encounter (Signed)
Pt called about lab results. Please advise

## 2019-10-01 NOTE — Telephone Encounter (Signed)
Patient scheduled for labs on 11/13/19. Patient states she was told when she spoke to you that she needed to come back this week as well? Did you decided differently?

## 2019-10-01 NOTE — Telephone Encounter (Signed)
I called Hannah Hart to give her the lab results. Positive RMSF IGG. She has had no HA, fever, body aches, malaise and the localized rash around the bite- is about gone. She has one more day of her 7-day Doxy course to take. Advised to call us if she develops any of the above symptoms.   PLAN:  Repeat RMSF, Lyme, and Ehrlichiosis labs in 6 weeks.

## 2019-10-02 NOTE — Telephone Encounter (Signed)
Certainly, please ready a copy for her to pick up.

## 2019-10-02 NOTE — Telephone Encounter (Signed)
Pt would like to pick up a copy of her lab results. Please call her back @ [phone]

## 2019-10-02 NOTE — Telephone Encounter (Signed)
Labs are printed and up front for pickup. Patient is aware.

## 2019-11-13 ENCOUNTER — Other Ambulatory Visit (INDEPENDENT_AMBULATORY_CARE_PROVIDER_SITE_OTHER): Payer: Medicare Other

## 2019-11-13 ENCOUNTER — Other Ambulatory Visit: Payer: Self-pay

## 2019-11-13 DIAGNOSIS — W57XXXD Bitten or stung by nonvenomous insect and other nonvenomous arthropods, subsequent encounter: Secondary | ICD-10-CM

## 2019-11-13 NOTE — Addendum Note (Signed)
Addended by: Leeanne Rio on: 11/13/2019 09:06 AM   Modules accepted: Orders

## 2019-11-21 LAB — EHRLICHIA ANTIBODY PANEL: E. Chaffeensis (HME) IgM Titer: NEGATIVE

## 2019-11-21 LAB — ROCKY MTN SPOTTED FVR ABS PNL(IGG+IGM): RMSF IgM: 0.27 index (ref 0.00–0.89)

## 2019-11-24 LAB — EHRLICHIA ANTIBODY PANEL
E.Chaffeensis (HME) IgG: NEGATIVE
HGE IgG Titer: NEGATIVE
HGE IgM Titer: NEGATIVE

## 2019-11-24 LAB — LYME AB/WESTERN BLOT REFLEX
LYME DISEASE AB, QUANT, IGM: <0.8 index (ref 0.00–0.79)
Lyme IgG/IgM Ab: <0.91 {ISR} (ref 0.00–0.90)

## 2019-11-24 LAB — ROCKY MTN SPOTTED FVR ABS PNL(IGG+IGM): RMSF IgG: NEGATIVE

## 2019-12-19 ENCOUNTER — Encounter (INDEPENDENT_AMBULATORY_CARE_PROVIDER_SITE_OTHER): Payer: Self-pay

## 2019-12-19 ENCOUNTER — Ambulatory Visit (INDEPENDENT_AMBULATORY_CARE_PROVIDER_SITE_OTHER): Payer: Medicare Other

## 2019-12-19 VITALS — Ht 67.2 in | Wt 125.0 lb

## 2019-12-19 DIAGNOSIS — Z0000 Encounter for general adult medical examination without abnormal findings: Secondary | ICD-10-CM

## 2019-12-19 NOTE — Patient Instructions (Addendum)
[name] , Thank you for taking time to come for your Medicare Wellness Visit. I appreciate your ongoing commitment to your health goals. Please review the following plan we discussed and let me know if I can assist you in the future.   These are the goals we discussed: Goals    . Follow up with Primary Care Provider     As need and maintain healthy lifestyle        This is a list of the screening recommended for you and due dates:  Health Maintenance  Topic Date Due  .  Hepatitis C: One time screening is recommended by Center for Disease Control  (CDC) for  adults born from [birth year] through 1963.   Never done  . COVID-19 Vaccine (1) 01/04/2020*  . Colon Cancer Screening  01/21/2020*  . Mammogram  03/05/2021  . Tetanus Vaccine  12/27/2022  . DEXA scan (bone density measurement)  Completed  . Flu Shot  Discontinued  . Pneumonia vaccines  Discontinued  *Topic was postponed. The date shown is not the original due date.    Immunizations Immunization History  Administered Date(s) Administered  . Tdap 12/26/2012    Keep all routine maintenance appointments.   Next scheduled fasting lab 01/19/20 @ 8:00  Cpe 01/21/20 @ 8:30  Advanced directives: End of life planning; Advance aging; Advanced directives discussed.  Copy of current HCPOA/Living Will requested.    Conditions/risks identified: none new.   Follow up in one year for your annual wellness visit.   Preventive Care 67 Years and Older, Female Preventive care refers to lifestyle choices and visits with your health care provider that can promote health and wellness. What does preventive care include?  A yearly physical exam. This is also called an annual well check.  Dental exams once or twice a year.  Routine eye exams. Ask your health care provider how often you should have your eyes checked.  Personal lifestyle choices, including:  Daily care of your teeth and gums.  Regular physical activity.  Eating a healthy  diet.  Avoiding tobacco and drug use.  Limiting alcohol use.  Practicing safe sex.  Taking low-dose aspirin every day.  Taking vitamin and mineral supplements as recommended by your health care provider. What happens during an annual well check? The services and screenings done by your health care provider during your annual well check will depend on your age, overall health, lifestyle risk factors, and family history of disease. Counseling  Your health care provider may ask you questions about your:  Alcohol use.  Tobacco use.  Drug use.  Emotional well-being.  Home and relationship well-being.  Sexual activity.  Eating habits.  History of falls.  Memory and ability to understand (cognition).  Work and work Statistician.  Reproductive health. Screening  You may have the following tests or measurements:  Height, weight, and BMI.  Blood pressure.  Lipid and cholesterol levels. These may be checked every 5 years, or more frequently if you are over 10 years old.  Skin check.  Lung cancer screening. You may have this screening every year starting at age 67 if you have a 30-pack-year history of smoking and currently smoke or have quit within the past 15 years.  Fecal occult blood test (FOBT) of the stool. You may have this test every year starting at age 67.  Flexible sigmoidoscopy or colonoscopy. You may have a sigmoidoscopy every 5 years or a colonoscopy every 10 years starting at age 40.  Hepatitis  C blood test.  Hepatitis B blood test.  Sexually transmitted disease (STD) testing.  Diabetes screening. This is done by checking your blood sugar (glucose) after you have not eaten for a while (fasting). You may have this done every 1-3 years.  Bone density scan. This is done to screen for osteoporosis. You may have this done starting at age 67.  Mammogram. This may be done every 1-2 years. Talk to your health care provider about how often you should have  regular mammograms. Talk with your health care provider about your test results, treatment options, and if necessary, the need for more tests. Vaccines  Your health care provider may recommend certain vaccines, such as:  Influenza vaccine. This is recommended every year.  Tetanus, diphtheria, and acellular pertussis (Tdap, Td) vaccine. You may need a Td booster every 10 years.  Zoster vaccine. You may need this after age 2.  Pneumococcal 13-valent conjugate (PCV13) vaccine. One dose is recommended after age 67.  Pneumococcal polysaccharide (PPSV23) vaccine. One dose is recommended after age 60. Talk to your health care provider about which screenings and vaccines you need and how often you need them. This information is not intended to replace advice given to you by your health care provider. Make sure you discuss any questions you have with your health care provider. Document Released: 05/14/2015 Document Revised: 01/05/2016 Document Reviewed: 02/16/2015 Elsevier Interactive Patient Education  2017 [place] Prevention in the Home Falls can cause injuries. They can happen to people of all ages. There are many things you can do to make your home safe and to help prevent falls. What can I do on the outside of my home?  Regularly fix the edges of walkways and driveways and fix any cracks.  Remove anything that might make you trip as you walk through a door, such as a raised step or threshold.  Trim any bushes or trees on the path to your home.  Use bright outdoor lighting.  Clear any walking paths of anything that might make someone trip, such as rocks or tools.  Regularly check to see if handrails are loose or broken. Make sure that both sides of any steps have handrails.  Any raised decks and porches should have guardrails on the edges.  Have any leaves, snow, or ice cleared regularly.  Use sand or salt on walking paths during winter.  Clean up any spills in your  garage right away. This includes oil or grease spills. What can I do in the bathroom?  Use night lights.  Install grab bars by the toilet and in the tub and shower. Do not use towel bars as grab bars.  Use non-skid mats or decals in the tub or shower.  If you need to sit down in the shower, use a plastic, non-slip stool.  Keep the floor dry. Clean up any water that spills on the floor as soon as it happens.  Remove soap buildup in the tub or shower regularly.  Attach bath mats securely with double-sided non-slip rug tape.  Do not have throw rugs and other things on the floor that can make you trip. What can I do in the bedroom?  Use night lights.  Make sure that you have a light by your bed that is easy to reach.  Do not use any sheets or blankets that are too big for your bed. They should not hang down onto the floor.  Have a firm chair that has side arms.  You can use this for support while you get dressed.  Do not have throw rugs and other things on the floor that can make you trip. What can I do in the kitchen?  Clean up any spills right away.  Avoid walking on wet floors.  Keep items that you use a lot in easy-to-reach places.  If you need to reach something above you, use a strong step stool that has a grab bar.  Keep electrical cords out of the way.  Do not use floor polish or wax that makes floors slippery. If you must use wax, use non-skid floor wax.  Do not have throw rugs and other things on the floor that can make you trip. What can I do with my stairs?  Do not leave any items on the stairs.  Make sure that there are handrails on both sides of the stairs and use them. Fix handrails that are broken or loose. Make sure that handrails are as long as the stairways.  Check any carpeting to make sure that it is firmly attached to the stairs. Fix any carpet that is loose or worn.  Avoid having throw rugs at the top or bottom of the stairs. If you do have throw  rugs, attach them to the floor with carpet tape.  Make sure that you have a light switch at the top of the stairs and the bottom of the stairs. If you do not have them, ask someone to add them for you. What else can I do to help prevent falls?  Wear shoes that:  Do not have high heels.  Have rubber bottoms.  Are comfortable and fit you well.  Are closed at the toe. Do not wear sandals.  If you use a stepladder:  Make sure that it is fully opened. Do not climb a closed stepladder.  Make sure that both sides of the stepladder are locked into place.  Ask someone to hold it for you, if possible.  Clearly mark and make sure that you can see:  Any grab bars or handrails.  First and last steps.  Where the edge of each step is.  Use tools that help you move around (mobility aids) if they are needed. These include:  Canes.  Walkers.  Scooters.  Crutches.  Turn on the lights when you go into a dark area. Replace any light bulbs as soon as they burn out.  Set up your furniture so you have a clear path. Avoid moving your furniture around.  If any of your floors are uneven, fix them.  If there are any pets around you, be aware of where they are.  Review your medicines with your doctor. Some medicines can make you feel dizzy. This can increase your chance of falling. Ask your doctor what other things that you can do to help prevent falls. This information is not intended to replace advice given to you by your health care provider. Make sure you discuss any questions you have with your health care provider. Document Released: 02/11/2009 Document Revised: 09/23/2015 Document Reviewed: 05/22/2014 Elsevier Interactive Patient Education  2017 [employer].

## 2019-12-19 NOTE — Progress Notes (Addendum)
Subjective:   Hannah Hart is a 69 y.o. female who presents for an Initial Medicare Annual Wellness Visit.  Review of Systems    No ROS.  Medicare Wellness Virtual Visit.    Cardiac Risk Factors include: advanced age (>87men, >52 women)     Objective:    Today's Vitals   12/19/19 1036  Weight: 125 lb (56.7 kg)  Height: 5' 7.2" (1.707 m)   Body mass index is 19.46 kg/m.  Advanced Directives 12/19/2019  Does Patient Have a Medical Advance Directive? No  Would patient like information on creating a medical advance directive? Yes (MAU/Ambulatory/Procedural Areas - Information given)    Current Medications (verified) Outpatient Encounter Medications as of 12/19/2019  Medication Sig  . fexofenadine (ALLEGRA) 180 MG tablet Take 180 mg by mouth as needed.  . fluocinonide cream (LIDEX) 0.05 % APPLY TO RASH TWICE DAILY AS NEEDED  . fluticasone (FLONASE) 50 MCG/ACT nasal spray Place 2 sprays into the nose as needed for rhinitis.  Marland Kitchen ibuprofen (ADVIL,MOTRIN) 200 MG tablet Take 200 mg by mouth every 6 (six) hours as needed for pain.  . Multiple Vitamins-Minerals (ALIVE ENERGY 50+ PO) Take by mouth.  . mupirocin ointment (BACTROBAN) 2 % Place 1 application into the nose 2 (two) times daily.   No facility-administered encounter medications on file as of 12/19/2019.    Allergies (verified) Propofol   History: Past Medical History:  Diagnosis Date  . Environmental allergies   . History of abnormal Pap smear   . History of chicken pox   . Uterine fibroid    Past Surgical History:  Procedure Laterality Date  . s/p intervention for uterine fibroids     Family History  Problem Relation Age of Onset  . Hypertension Mother   . Atrial fibrillation Mother   . Lung cancer Father        died age 36  . Cancer Maternal Uncle        mouth and throat cancer  . Heart disease Other        aunt  . CVA Other        aunt  . Heart disease Maternal Aunt        s/p CABG   Social History    Socioeconomic History  . Marital status: Married    Spouse name: Not on file  . Number of children: Not on file  . Years of education: Not on file  . Highest education level: Not on file  Occupational History  . Not on file  Tobacco Use  . Smoking status: Never Smoker  . Smokeless tobacco: Never Used  Substance and Sexual Activity  . Alcohol use: No    Alcohol/week: 0.0 standard drinks  . Drug use: No  . Sexual activity: Not on file  Other Topics Concern  . Not on file  Social History Narrative  . Not on file   Social Determinants of Health   Financial Resource Strain: Low Risk   . Difficulty of Paying Living Expenses: Not hard at all  Food Insecurity: No Food Insecurity  . Worried About Charity fundraiser in the Last Year: Never true  . Ran Out of Food in the Last Year: Never true  Transportation Needs: No Transportation Needs  . Lack of Transportation (Medical): No  . Lack of Transportation (Non-Medical): No  Physical Activity: Sufficiently Active  . Days of Exercise per Week: 5 days  . Minutes of Exercise per Session: 30 min  Stress: No Stress  Concern Present  . Feeling of Stress : Not at all  Social Connections: Unknown  . Frequency of Communication with Friends and Family: More than three times a week  . Frequency of Social Gatherings with Friends and Family: More than three times a week  . Attends Religious Services: Not on file  . Active Member of Clubs or Organizations: Not on file  . Attends Archivist Meetings: Not on file  . Marital Status: Not on file    Tobacco Counseling Counseling given: Not Answered   Clinical Intake:  Pre-visit preparation completed: Yes        Diabetes: No  How often do you need to have someone help you when you read instructions, pamphlets, or other written materials from your doctor or pharmacy?: 1 - Never  Interpreter Needed?: No      Activities of Daily Living In your present state of health, do  you have any difficulty performing the following activities: 12/19/2019  Hearing? N  Vision? N  Difficulty concentrating or making decisions? N  Walking or climbing stairs? N  Dressing or bathing? N  Doing errands, shopping? N  Preparing Food and eating ? N  Using the Toilet? N  In the past six months, have you accidently leaked urine? N  Do you have problems with loss of bowel control? N  Managing your Medications? N  Managing your Finances? N  Housekeeping or managing your Housekeeping? N  Some recent data might be hidden    Patient Care Team: Einar Pheasant, MD as PCP - General (Internal Medicine)  Indicate any recent Medical Services you may have received from other than [facility] providers in the past year (date may be approximate).     Assessment:   This is a routine wellness examination for Hannah Hart.   I connected with Hannah Hart today by telephone and verified that I am speaking with the correct person using two identifiers. Location patient: home Location provider: work Persons participating in the virtual visit: patient, Marine scientist.    I discussed the limitations, risks, security and privacy concerns of performing an evaluation and management service by telephone and the availability of in person appointments. The patient expressed understanding and verbally consented to this telephonic visit.    Interactive audio and video telecommunications were attempted between this provider and patient, however failed, due to patient having technical difficulties OR patient did not have access to video capability.  We continued and completed visit with audio only.  Some vital signs may be absent or patient reported.   Hearing/Vision screen  Hearing Screening   125Hz  250Hz  500Hz  1000Hz  2000Hz  3000Hz  4000Hz  6000Hz  8000Hz   Right ear:           Left ear:           Comments: Patient is able to hear conversational tones without difficulty.  No issues reported.  Vision Screening Comments:  Followed by [facility] Cataract extraction, bilateral Visual acuity not assessed, virtual visit.    Dietary issues and exercise activities discussed: Current Exercise Habits: Home exercise routine, Type of exercise: walking, Intensity: Moderate  Goals    . Follow up with Primary Care Provider     As need and maintain healthy lifestyle       Depression Screen PHQ 2/9 Scores 12/19/2019 01/20/2019 01/08/2017 01/06/2016  PHQ - 2 Score 0 0 0 0    Fall Risk Fall Risk  12/19/2019 09/26/2019 01/20/2019 01/08/2017 01/06/2016  Falls in the past year? 0 0 0  No No  Number falls in past yr: 0 - - - -  Injury with Fall? 0 - - - -  Follow up Falls evaluation completed Falls evaluation completed Falls evaluation completed - -   Handrails in use when climbing stairs? Yes  Home free of loose throw rugs in walkways, pet beds, electrical cords, etc? Yes  Adequate lighting in your home to reduce risk of falls? Yes   ASSISTIVE DEVICES UTILIZED TO PREVENT FALLS:  Use of a cane, walker or w/c? No  Grab bars in the bathroom? No  Shower chair or bench in shower? Yes  Elevated toilet seat or a handicapped toilet? Yes   TIMED UP AND GO:  Was the test performed? No . Virtual visit.   Cognitive Function:  Patient is alert and oriented x3.  Enjoys brain challenging activities, reading.  Denies difficulty focusing, concentrating, making decisions, memory loss.    6CIT Screen 12/19/2019  What Year? 0 points  What month? 0 points  What time? 0 points    Immunizations Immunization History  Administered Date(s) Administered  . Tdap 12/26/2012    Health Maintenance Health Maintenance  Topic Date Due  . Hepatitis C Screening  Never done  . COVID-19 Vaccine (1) 01/04/2020 (Originally 03/19/1963)  . COLONOSCOPY  01/21/2020 (Originally 03/20/2003)  . MAMMOGRAM  03/05/2021  . TETANUS/TDAP  12/27/2022  . DEXA SCAN  Completed  . INFLUENZA VACCINE  Discontinued  . PNA vac Low Risk Adult   Discontinued    Dental Screening: Recommended annual dental exams for proper oral hygiene. Visits every 6 months.   Community Resource Referral / Chronic Care Management: CRR required this visit?  No   CCM required this visit?  No      Plan:   Keep all routine maintenance appointments.   Next scheduled fasting lab 01/19/20 @ 8:00  Cpe 01/21/20 @ 8:30  I have personally reviewed and noted the following in the patient's chart:   . Medical and social history . Use of alcohol, tobacco or illicit drugs  . Current medications and supplements . Functional ability and status . Nutritional status . Physical activity . Advanced directives . List of other physicians . Hospitalizations, surgeries, and ER visits in previous 12 months . Vitals . Screenings to include cognitive, depression, and falls . Referrals and appointments  In addition, I have reviewed and discussed with patient certain preventive protocols, quality metrics, and best practice recommendations. A written personalized care plan for preventive services as well as general preventive health recommendations were provided to patient via mail.     Varney Biles, LPN   05/28/7865    Reviewed above information.  Agree with assessment and plan.   Dr Nicki Reaper

## 2020-01-19 ENCOUNTER — Other Ambulatory Visit: Payer: Medicare Other

## 2020-01-21 ENCOUNTER — Ambulatory Visit: Payer: Medicare Other | Admitting: Internal Medicine

## 2020-02-10 ENCOUNTER — Telehealth: Payer: Self-pay | Admitting: *Deleted

## 2020-02-10 DIAGNOSIS — M810 Age-related osteoporosis without current pathological fracture: Secondary | ICD-10-CM

## 2020-02-10 DIAGNOSIS — Z13220 Encounter for screening for lipoid disorders: Secondary | ICD-10-CM

## 2020-02-10 DIAGNOSIS — D649 Anemia, unspecified: Secondary | ICD-10-CM

## 2020-02-10 NOTE — Telephone Encounter (Signed)
Please place future orders for lab appt.  

## 2020-02-11 ENCOUNTER — Other Ambulatory Visit (INDEPENDENT_AMBULATORY_CARE_PROVIDER_SITE_OTHER): Payer: Medicare Other

## 2020-02-11 ENCOUNTER — Other Ambulatory Visit: Payer: Self-pay

## 2020-02-11 DIAGNOSIS — D649 Anemia, unspecified: Secondary | ICD-10-CM

## 2020-02-11 DIAGNOSIS — M810 Age-related osteoporosis without current pathological fracture: Secondary | ICD-10-CM

## 2020-02-11 DIAGNOSIS — Z13220 Encounter for screening for lipoid disorders: Secondary | ICD-10-CM | POA: Diagnosis not present

## 2020-02-11 LAB — COMPREHENSIVE METABOLIC PANEL WITH GFR
ALT: 18 U/L (ref 0–35)
AST: 21 U/L (ref 0–37)
Albumin: 4.3 g/dL (ref 3.5–5.2)
Alkaline Phosphatase: 79 U/L (ref 39–117)
BUN: 14 mg/dL (ref 6–23)
CO2: 31 meq/L (ref 19–32)
Calcium: 9.7 mg/dL (ref 8.4–10.5)
Chloride: 102 meq/L (ref 96–112)
Creatinine, Ser: 0.72 mg/dL (ref 0.40–1.20)
GFR: 86.71 mL/min
Glucose, Bld: 94 mg/dL (ref 70–99)
Potassium: 3.8 meq/L (ref 3.5–5.1)
Sodium: 139 meq/L (ref 135–145)
Total Bilirubin: 0.5 mg/dL (ref 0.2–1.2)
Total Protein: 7.1 g/dL (ref 6.0–8.3)

## 2020-02-11 LAB — CBC WITH DIFFERENTIAL/PLATELET
Basophils Absolute: 0.1 10*3/uL (ref 0.0–0.1)
Basophils Relative: 0.6 % (ref 0.0–3.0)
Eosinophils Absolute: 0.1 10*3/uL (ref 0.0–0.7)
Eosinophils Relative: 0.6 % (ref 0.0–5.0)
HCT: 37.8 % (ref 36.0–46.0)
Hemoglobin: 12.7 g/dL (ref 12.0–15.0)
Lymphocytes Relative: 16.6 % (ref 12.0–46.0)
Lymphs Abs: 1.5 10*3/uL (ref 0.7–4.0)
MCHC: 33.7 g/dL (ref 30.0–36.0)
MCV: 93.1 fl (ref 78.0–100.0)
Monocytes Absolute: 0.6 10*3/uL (ref 0.1–1.0)
Monocytes Relative: 6.3 % (ref 3.0–12.0)
Neutro Abs: 6.9 10*3/uL (ref 1.4–7.7)
Neutrophils Relative %: 75.9 % (ref 43.0–77.0)
Platelets: 263 10*3/uL (ref 150.0–400.0)
RBC: 4.06 Mil/uL (ref 3.87–5.11)
RDW: 13.2 % (ref 11.5–15.5)
WBC: 9.1 10*3/uL (ref 4.0–10.5)

## 2020-02-11 LAB — LIPID PANEL
Cholesterol: 227 mg/dL — ABNORMAL HIGH (ref 0–200)
HDL: 100.4 mg/dL (ref >39.00)
LDL Cholesterol: 112 mg/dL — ABNORMAL HIGH (ref 0–99)
NonHDL: 127.01
Total CHOL/HDL Ratio: 2
Triglycerides: 77 mg/dL (ref 0.0–149.0)
VLDL: 15.4 mg/dL (ref 0.0–40.0)

## 2020-02-11 LAB — VITAMIN D 25 HYDROXY (VIT D DEFICIENCY, FRACTURES): VITD: 32.76 ng/mL (ref 30.00–100.00)

## 2020-02-11 LAB — TSH: TSH: 1.37 u[IU]/mL (ref 0.35–4.50)

## 2020-02-11 NOTE — Telephone Encounter (Signed)
Orders placed for labs

## 2020-02-13 ENCOUNTER — Other Ambulatory Visit: Payer: Self-pay

## 2020-02-13 ENCOUNTER — Ambulatory Visit (INDEPENDENT_AMBULATORY_CARE_PROVIDER_SITE_OTHER): Payer: Medicare Other | Admitting: Internal Medicine

## 2020-02-13 ENCOUNTER — Encounter: Payer: Self-pay | Admitting: Internal Medicine

## 2020-02-13 VITALS — BP 140/70 | HR 83 | Temp 98.1°F | Ht 67.0 in | Wt 120.6 lb

## 2020-02-13 DIAGNOSIS — Z1231 Encounter for screening mammogram for malignant neoplasm of breast: Secondary | ICD-10-CM | POA: Diagnosis not present

## 2020-02-13 DIAGNOSIS — D649 Anemia, unspecified: Secondary | ICD-10-CM

## 2020-02-13 DIAGNOSIS — M810 Age-related osteoporosis without current pathological fracture: Secondary | ICD-10-CM

## 2020-02-13 DIAGNOSIS — R634 Abnormal weight loss: Secondary | ICD-10-CM

## 2020-02-13 DIAGNOSIS — R21 Rash and other nonspecific skin eruption: Secondary | ICD-10-CM | POA: Diagnosis not present

## 2020-02-13 DIAGNOSIS — Z0000 Encounter for general adult medical examination without abnormal findings: Secondary | ICD-10-CM

## 2020-02-13 MED ORDER — TRIAMCINOLONE ACETONIDE 0.1 % EX CREA: 1.0000 "application " | TOPICAL_CREAM | Freq: Two times a day (BID) | CUTANEOUS | 0 refills | Status: DC

## 2020-02-13 MED ORDER — MUPIROCIN 2 % EX OINT
TOPICAL_OINTMENT | CUTANEOUS | 0 refills | Status: DC
Start: 2020-02-13 — End: 2021-02-16

## 2020-02-13 NOTE — Progress Notes (Signed)
Patient ID: Hannah Hart, female   DOB: [birth year], 69 y.o.   MRN: [id number]   Subjective:    Patient ID: Hannah Hart, female    DOB: 08/19/1950, 69 y.o.   MRN: [id number]  HPI This visit occurred during the SARS-CoV-2 public health emergency.  Safety protocols were in place, including screening questions prior to the visit, additional usage of staff PPE, and extensive cleaning of exam room while observing appropriate contact time as indicated for disinfecting solutions.  Patient with past history of osteoporosis. She comes in today to follow up on this as well as for a complete physical exam.  Recently diagnosed with covid.  Had headache/sinus symptoms. Evaluated at acute care.  Temperature 101.6.   Treated for sinus infection with augmentin.  covid test returned positive.  Stopped augmentin - developed a rash under her breast and noticed on her back.  No sinus congestion or drainage now.  No chest congestion, chest pain or sob.  No cough.  No acid reflux.  No abdominal pain.  Bowels moving.  No headache or fever.  Rash - under breast and back - better.  Some itching.  Previous tick bite.  Treated with doxycycline - 08/2019.    Past Medical History:  Diagnosis Date  . Environmental allergies   . History of abnormal Pap smear   . History of chicken pox   . Uterine fibroid    Past Surgical History:  Procedure Laterality Date  . s/p intervention for uterine fibroids     Family History  Problem Relation Age of Onset  . Hypertension Mother   . Atrial fibrillation Mother   . Lung cancer Father        died age 4  . Cancer Maternal Uncle        mouth and throat cancer  . Heart disease Other        aunt  . CVA Other        aunt  . Heart disease Maternal Aunt        s/p CABG   Social History   Socioeconomic History  . Marital status: Married    Spouse name: Not on file  . Number of children: Not on file  . Years of education: Not on file  . Highest education level: Not on file    Occupational History  . Not on file  Tobacco Use  . Smoking status: Never Smoker  . Smokeless tobacco: Never Used  Substance and Sexual Activity  . Alcohol use: No    Alcohol/week: 0.0 standard drinks  . Drug use: No  . Sexual activity: Not on file  Other Topics Concern  . Not on file  Social History Narrative  . Not on file   Social Determinants of Health   Financial Resource Strain: Low Risk   . Difficulty of Paying Living Expenses: Not hard at all  Food Insecurity: No Food Insecurity  . Worried About Charity fundraiser in the Last Year: Never true  . Ran Out of Food in the Last Year: Never true  Transportation Needs: No Transportation Needs  . Lack of Transportation (Medical): No  . Lack of Transportation (Non-Medical): No  Physical Activity: Sufficiently Active  . Days of Exercise per Week: 5 days  . Minutes of Exercise per Session: 30 min  Stress: No Stress Concern Present  . Feeling of Stress : Not at all  Social Connections: Unknown  . Frequency of Communication with Friends and Family: More than three times a  week  . Frequency of Social Gatherings with Friends and Family: More than three times a week  . Attends Religious Services: Not on file  . Active Member of Clubs or Organizations: Not on file  . Attends Archivist Meetings: Not on file  . Marital Status: Not on file    Outpatient Encounter Medications as of 02/13/2020  Medication Sig  . fexofenadine (ALLEGRA) 180 MG tablet Take 180 mg by mouth as needed.  . fluocinonide cream (LIDEX) 0.05 % APPLY TO RASH TWICE DAILY AS NEEDED  . ibuprofen (ADVIL,MOTRIN) 200 MG tablet Take 200 mg by mouth every 6 (six) hours as needed for pain.  . Multiple Vitamins-Minerals (ALIVE ENERGY 50+ PO) Take by mouth.  . mupirocin ointment (BACTROBAN) 2 % Place 1 application into the nose 2 (two) times daily.  . fluticasone (FLONASE) 50 MCG/ACT nasal spray Place 2 sprays into the nose as needed for rhinitis. (Patient not  taking: Reported on 02/13/2020)   No facility-administered encounter medications on file as of 02/13/2020.    Review of Systems  Constitutional: Negative for appetite change and unexpected weight change.  HENT: Negative for congestion, sinus pressure and sore throat.   Eyes: Negative for pain and visual disturbance.  Respiratory: Negative for cough, chest tightness and shortness of breath.   Cardiovascular: Negative for chest pain, palpitations and leg swelling.  Gastrointestinal: Negative for abdominal pain, diarrhea, nausea and vomiting.  Genitourinary: Negative for difficulty urinating and dysuria.  Musculoskeletal: Negative for back pain and joint swelling.  Skin: Negative for color change and rash.  Neurological: Negative for dizziness, light-headedness and headaches.  Hematological: Negative for adenopathy. Does not bruise/bleed easily.  Psychiatric/Behavioral: Negative for agitation and dysphoric mood.       Objective:    Physical Exam Vitals reviewed.  Constitutional:      General: She is not in acute distress.    Appearance: Normal appearance. She is well-developed.  HENT:     Head: Normocephalic and atraumatic.     Right Ear: External ear normal.     Left Ear: External ear normal.  Eyes:     General: No scleral icterus.       Right eye: No discharge.        Left eye: No discharge.     Conjunctiva/sclera: Conjunctivae normal.  Neck:     Thyroid: No thyromegaly.  Cardiovascular:     Rate and Rhythm: Normal rate and regular rhythm.  Pulmonary:     Effort: No tachypnea, accessory muscle usage or respiratory distress.     Breath sounds: Normal breath sounds. No decreased breath sounds or wheezing.  Chest:     Breasts:        Right: No inverted nipple, mass, nipple discharge or tenderness (no axillary adenopathy).        Left: No inverted nipple, mass, nipple discharge or tenderness (no axilarry adenopathy).  Abdominal:     General: Bowel sounds are normal.      Palpations: Abdomen is soft.     Tenderness: There is no abdominal tenderness.  Musculoskeletal:        General: No swelling or tenderness.     Cervical back: Neck supple. No tenderness.  Lymphadenopathy:     Cervical: No cervical adenopathy.  Skin:    Findings: No erythema.     Comments: Rash - beneath breast and back.  No increased erythema.    Neurological:     Mental Status: She is alert and oriented to person, place,  and time.  Psychiatric:        Mood and Affect: Mood normal.        Behavior: Behavior normal.     BP 140/70   Pulse 83   Temp 98.1 F (36.7 C) (Oral)   Ht 5\' 7"  (1.702 m)   Wt 120 lb 9.6 oz (54.7 kg)   SpO2 97%   BMI 18.89 kg/m  Wt Readings from Last 3 Encounters:  02/13/20 120 lb 9.6 oz (54.7 kg)  12/19/19 125 lb (56.7 kg)  09/26/19 125 lb 6.4 oz (56.9 kg)     Lab Results  Component Value Date   WBC 9.1 02/11/2020   HGB 12.7 02/11/2020   HCT 37.8 02/11/2020   PLT 263.0 02/11/2020   GLUCOSE 94 02/11/2020   CHOL 227 (H) 02/11/2020   TRIG 77.0 02/11/2020   HDL 100.40 02/11/2020   LDLDIRECT 99.2 12/26/2012   LDLCALC 112 (H) 02/11/2020   ALT 18 02/11/2020   AST 21 02/11/2020   NA 139 02/11/2020   K 3.8 02/11/2020   CL 102 02/11/2020   CREATININE 0.72 02/11/2020   BUN 14 02/11/2020   CO2 31 02/11/2020   TSH 1.37 02/11/2020    DG Bone Density  Result Date: 03/06/2019 EXAM: DUAL X-RAY ABSORPTIOMETRY (DXA) FOR BONE MINERAL DENSITY IMPRESSION: Technologist: SCE PATIENT BIOGRAPHICAL: Name: Kebrina, Friend Patient ID: [id number] Birth Date: 1950-08-20 Height: 67.7 in. Gender: Female Exam Date: 03/06/2019 Weight: 122.4 lbs. Indications: Postmenopausal, Caucasian Fractures: Treatments: Multi-Vitamin, Vitamin D ASSESSMENT: The BMD measured at AP Spine L1-L4 is 0.831 g/cm2 with a T-score of -2.9. This patient is considered osteoporotic according to [facility]) criteria. The scan quality is good. Site Region Measured Measured WHO Young  Adult BMD Date       Age      Classification T-score AP Spine L1-L4 03/06/2019 65.9 Osteoporosis -2.9 0.831 g/cm2 DualFemur Neck Right 03/06/2019 65.9 Osteoporosis -2.5 0.696 g/cm2 DualFemur Neck Right 02/19/2017 63.9 Osteopenia -2.3 0.723 g/cm2 DualFemur Total Mean 03/06/2019 65.9 Osteopenia -2.2 0.736 g/cm2 DualFemur Total Mean 02/19/2017 63.9 Osteopenia -2.1 0.744 g/cm2 World Health Organization [facility]) criteria for post-menopausal, Caucasian Women: Normal:       T-score at or above -1 SD Osteopenia:   T-score between -1 and -2.5 SD Osteoporosis: T-score at or below -2.5 SD RECOMMENDATIONS: 1. All patients should optimize calcium and vitamin D intake. 2. Consider FDA-approved medical therapies in postmenopausal women and men aged 19 years and older, based on the following: a. A hip or vertebral(clinical or morphometric) fracture b. T-score < -2.5 at the femoral neck or spine after appropriate evaluation to exclude secondary causes c. Low bone mass (T-score between -1.0 and -2.5 at the femoral neck or spine) and a 10-year probability of a hip fracture > 3% or a 10-year probability of a major osteoporosis-related fracture > 20% based on the [place]-adapted WHO algorithm d. Clinician judgment and/or patient preferences may indicate treatment for people with 10-year fracture probabilities above or below these levels FOLLOW-UP: People with diagnosed cases of osteoporosis or at high risk for fracture should have regular bone mineral density tests. For patients eligible for Medicare, routine testing is allowed once every 2 years. The testing frequency can be increased to one year for patients who have rapidly progressing disease, those who are receiving or discontinuing medical therapy to restore bone mass, or have additional risk factors. I have reviewed this report, and agree with the above findings. [facility] Radiology Electronically Signed   By: Gwyndolyn Saxon  Jasmine December III M.D.   On: 03/06/2019 13:56   MM 3D SCREEN BREAST  BILATERAL  Result Date: 03/07/2019 CLINICAL DATA:  Screening. EXAM: DIGITAL SCREENING BILATERAL MAMMOGRAM WITH TOMO AND CAD COMPARISON:  Previous exam(s). ACR Breast Density Category c: The breast tissue is heterogeneously dense, which may obscure small masses. FINDINGS: There are no findings suspicious for malignancy. Images were processed with CAD. IMPRESSION: No mammographic evidence of malignancy. A result letter of this screening mammogram will be mailed directly to the patient. RECOMMENDATION: Screening mammogram in one year. (Code:SM-B-01Y) BI-RADS CATEGORY  1: Negative. Electronically Signed   By: Curlene Dolphin M.D.   On: 03/07/2019 11:10       Assessment & Plan:   Problem List Items Addressed This Visit    None       Einar Pheasant, MD

## 2020-02-13 NOTE — Patient Instructions (Signed)
Take an antihistamine (for example claritin, zyrtec or allegra) daily for the next 7-10 days and see if rash improves.

## 2020-02-14 ENCOUNTER — Encounter: Payer: Self-pay | Admitting: Internal Medicine

## 2020-02-14 NOTE — Assessment & Plan Note (Signed)
Continue calcium, vitamin D and weight bearing exercise.  Has declined rx medication.    Follow.  

## 2020-02-14 NOTE — Assessment & Plan Note (Signed)
Rash as outlined.  Occurred after taking augmentin. Does not appear to be a typical drug rash.  Localized under breast and back.  TCC cream as directed.  Call with update.  No fever.  No joint aches.  Follow.

## 2020-02-14 NOTE — Assessment & Plan Note (Signed)
Follow cbc.  

## 2020-02-14 NOTE — Assessment & Plan Note (Signed)
Some weight loss as outlined.  States occurred with covid.  Eating better now.  Follow.

## 2020-02-14 NOTE — Assessment & Plan Note (Signed)
Physical today 02/13/20.  PAP 12/2016- negative with negative HPV.  Mammogram 03/07/19 - Birads I.  Has received her notice.  Order placed.  She will schedule mammogram. Discussed duet colonoscopy.

## 2020-03-24 ENCOUNTER — Ambulatory Visit
Admission: RE | Admit: 2020-03-24 | Discharge: 2020-03-24 | Disposition: A | Discharge status: Home / Self Care | Payer: Medicare Other | Source: Ambulatory Visit | Attending: Internal Medicine | Admitting: Internal Medicine

## 2020-03-24 ENCOUNTER — Other Ambulatory Visit: Payer: Self-pay

## 2020-03-24 DIAGNOSIS — Z1231 Encounter for screening mammogram for malignant neoplasm of breast: Secondary | ICD-10-CM | POA: Insufficient documentation

## 2020-12-16 ENCOUNTER — Telehealth: Payer: Self-pay | Admitting: Internal Medicine

## 2020-12-16 NOTE — Telephone Encounter (Signed)
Patient informed, Due to the high volume of calls and your symptoms we have to forward your call to our Triage Nurse to expedient your call. Please hold for the transfer.  Patient transferred to [facility] at Access Nurse. Due to a possible ear infection and pain in her right ear.No openings in office or virtual.

## 2020-12-16 NOTE — Telephone Encounter (Signed)
Patient was very upset that she could not be seen in our office. She stated she will go to [facility].

## 2020-12-21 ENCOUNTER — Telehealth: Payer: Self-pay

## 2020-12-21 ENCOUNTER — Ambulatory Visit: Payer: Medicare Other

## 2020-12-21 NOTE — Telephone Encounter (Signed)
Unsuccessful attempt to reach patient for scheduled AWV. Let message to reschedule.

## 2020-12-27 ENCOUNTER — Telehealth: Payer: Self-pay | Admitting: Internal Medicine

## 2020-12-27 NOTE — Telephone Encounter (Signed)
Confirm no other symptoms.  I can work her in Wednesday pm.

## 2020-12-27 NOTE — Telephone Encounter (Signed)
Pt returned your cal

## 2020-12-27 NOTE — Telephone Encounter (Signed)
Patient called in stating she talked with Access Nurse last week and they told her to call back to [place] office this week to see if they have any openings. Offered to transfer patient to Access Nurse to be triaged but patient declined and stated all they will do is send her to urgent care.Please advise.

## 2020-12-27 NOTE — Telephone Encounter (Signed)
Called patient for more info. Complaining of right ear pain. This started on 8/18 and was sent to urgent care. Given zithromax that she took for 5 days. Symptoms resolved while on abx but the ear pain returned a few days after stopping abx. She was having decreased hearing prior to the zithromax which resolved and has not returned. Now she is having the ear pain and says her ear itches a little bit. She is wondering if she may need another round of abx. She is requesting to be worked in if appt is needed. She does feel that the abx helped but did not have enough to clear it completely. Denies any other acute symptoms.

## 2020-12-27 NOTE — Telephone Encounter (Signed)
LM for more info

## 2020-12-27 NOTE — Telephone Encounter (Signed)
Confirmed no other symptoms. Patient has been scheduled.

## 2020-12-29 ENCOUNTER — Ambulatory Visit (INDEPENDENT_AMBULATORY_CARE_PROVIDER_SITE_OTHER): Payer: Medicare Other | Admitting: Internal Medicine

## 2020-12-29 ENCOUNTER — Other Ambulatory Visit: Payer: Self-pay

## 2020-12-29 DIAGNOSIS — R0989 Other specified symptoms and signs involving the circulatory and respiratory systems: Secondary | ICD-10-CM | POA: Insufficient documentation

## 2020-12-29 DIAGNOSIS — H9201 Otalgia, right ear: Secondary | ICD-10-CM

## 2020-12-29 DIAGNOSIS — Z8249 Family history of ischemic heart disease and other diseases of the circulatory system: Secondary | ICD-10-CM | POA: Diagnosis not present

## 2020-12-29 NOTE — Progress Notes (Signed)
Patient ID: Calyssa Yardley, female   DOB: 1950-08-07, 70 y.o.   MRN: [id number]   Subjective:    Patient ID: Tiffiany Fuoco, female    DOB: 1951-01-26, 70 y.o.   MRN: [id number]  This visit occurred during the SARS-CoV-2 public health emergency.  Safety protocols were in place, including screening questions prior to the visit, additional usage of staff PPE, and extensive cleaning of exam room while observing appropriate contact time as indicated for disinfecting solutions.   Patient here for work in appt. Chief Complaint  Patient presents with   Ear Pain   .   HPI Work in for evaluation of ear pain.  States noticed symptoms - 12/14/20.  On 8/17 - ears felt stopped up.  8/18 - right earache.  Was diagnosed with right otitis media.  Treated with allegra and azithromycin.  Improved, but persistent symptoms.  Over this past weekend, noticed pain - swallowing - that extended up into her jaw.  No headache.  No fever.  No sinus congestion or nasal congestion.  No sore throat.  Ear is better now.  No pain.  No chest congestion or chest pain.  No sob.  No nausea or vomiting.  Does report family history of aortic aneurysm.  Multiple family members.     Past Medical History:  Diagnosis Date   Environmental allergies    History of abnormal Pap smear    History of chicken pox    Uterine fibroid    Past Surgical History:  Procedure Laterality Date   s/p intervention for uterine fibroids     Family History  Problem Relation Age of Onset   Hypertension Mother    Atrial fibrillation Mother    Lung cancer Father        died age 108   Cancer Maternal Uncle        mouth and throat cancer   Heart disease Other        aunt   CVA Other        aunt   Heart disease Maternal Aunt        s/p CABG   Breast cancer Neg Hx    Social History   Socioeconomic History   Marital status: Married    Spouse name: Not on file   Number of children: Not on file   Years of education: Not on file   Highest  education level: Not on file  Occupational History   Not on file  Tobacco Use   Smoking status: Never   Smokeless tobacco: Never  Substance and Sexual Activity   Alcohol use: No    Alcohol/week: 0.0 standard drinks   Drug use: No   Sexual activity: Not on file  Other Topics Concern   Not on file  Social History Narrative   Not on file   Social Determinants of Health   Financial Resource Strain: Not on file  Food Insecurity: Not on file  Transportation Needs: Not on file  Physical Activity: Not on file  Stress: Not on file  Social Connections: Not on file    Review of Systems  Constitutional:  Negative for appetite change and fever.  HENT:  Negative for congestion and sinus pressure.        Previous ear pain as outlined.  Resolved now.   Respiratory:  Negative for cough, chest tightness and shortness of breath.   Cardiovascular:  Negative for chest pain, palpitations and leg swelling.  Gastrointestinal:  Negative for abdominal pain, diarrhea, nausea and  vomiting.  Musculoskeletal:  Negative for joint swelling and myalgias.  Skin:  Negative for color change and rash.  Neurological:  Negative for dizziness, light-headedness and headaches.  Psychiatric/Behavioral:  Negative for agitation and dysphoric mood.       Objective:     BP 138/76   Pulse 63   Temp 97.8 F (36.6 C)   Resp 16   Ht 5' 7.5" (1.715 m)   Wt 126 lb 9.6 oz (57.4 kg)   SpO2 97%   BMI 19.54 kg/m  Wt Readings from Last 3 Encounters:  12/29/20 126 lb 9.6 oz (57.4 kg)  02/13/20 120 lb 9.6 oz (54.7 kg)  12/19/19 125 lb (56.7 kg)    Physical Exam Vitals reviewed.  Constitutional:      General: She is not in acute distress.    Appearance: Normal appearance.  HENT:     Head: Normocephalic and atraumatic.     Right Ear: Tympanic membrane, ear canal and external ear normal. There is no impacted cerumen.     Left Ear: Tympanic membrane, ear canal and external ear normal. There is no impacted cerumen.   Eyes:     General: No scleral icterus.       Right eye: No discharge.        Left eye: No discharge.     Conjunctiva/sclera: Conjunctivae normal.  Neck:     Thyroid: No thyromegaly.  Cardiovascular:     Rate and Rhythm: Normal rate and regular rhythm.  Pulmonary:     Effort: No respiratory distress.     Breath sounds: Normal breath sounds. No wheezing.  Abdominal:     General: Bowel sounds are normal.     Palpations: Abdomen is soft.     Tenderness: There is no abdominal tenderness.     Comments: Abdominal bruit.   Musculoskeletal:        General: No swelling or tenderness.     Cervical back: Neck supple. No tenderness.  Lymphadenopathy:     Cervical: No cervical adenopathy.  Skin:    Findings: No erythema or rash.  Neurological:     Mental Status: She is alert.  Psychiatric:        Mood and Affect: Mood normal.        Behavior: Behavior normal.    Outpatient Encounter Medications as of 12/29/2020  Medication Sig   fexofenadine (ALLEGRA) 180 MG tablet Take 180 mg by mouth as needed.   ibuprofen (ADVIL,MOTRIN) 200 MG tablet Take 200 mg by mouth every 6 (six) hours as needed for pain.   Multiple Vitamins-Minerals (ALIVE ENERGY 50+ PO) Take by mouth.   mupirocin ointment (BACTROBAN) 2 % Apply to affected area bid   [DISCONTINUED] fluocinonide cream (LIDEX) 0.05 % APPLY TO RASH TWICE DAILY AS NEEDED   [DISCONTINUED] fluticasone (FLONASE) 50 MCG/ACT nasal spray Place 2 sprays into the nose as needed for rhinitis. (Patient not taking: Reported on 02/13/2020)   [DISCONTINUED] triamcinolone cream (KENALOG) 0.1 % Apply 1 application topically 2 (two) times daily.   No facility-administered encounter medications on file as of 12/29/2020.     Lab Results  Component Value Date   WBC 9.1 02/11/2020   HGB 12.7 02/11/2020   HCT 37.8 02/11/2020   PLT 263.0 02/11/2020   GLUCOSE 94 02/11/2020   CHOL 227 (H) 02/11/2020   TRIG 77.0 02/11/2020   HDL 100.40 02/11/2020   LDLDIRECT 99.2  12/26/2012   LDLCALC 112 (H) 02/11/2020   ALT 18 02/11/2020   AST  21 02/11/2020   NA 139 02/11/2020   K 3.8 02/11/2020   CL 102 02/11/2020   CREATININE 0.72 02/11/2020   BUN 14 02/11/2020   CO2 31 02/11/2020   TSH 1.37 02/11/2020    MM 3D SCREEN BREAST BILATERAL  Result Date: 03/28/2020 CLINICAL DATA:  Screening. EXAM: DIGITAL SCREENING BILATERAL MAMMOGRAM WITH TOMO AND CAD COMPARISON:  Previous exam(s). ACR Breast Density Category c: The breast tissue is heterogeneously dense, which may obscure small masses. FINDINGS: There are no findings suspicious for malignancy. Images were processed with CAD. IMPRESSION: No mammographic evidence of malignancy. A result letter of this screening mammogram will be mailed directly to the patient. RECOMMENDATION: Screening mammogram in one year. (Code:SM-B-01Y) BI-RADS CATEGORY  1: Negative. Electronically Signed   By: Fidela Salisbury M.D.   On: 03/28/2020 16:00       Assessment & Plan:   Problem List Items Addressed This Visit     Abdominal bruit    Abdominal bruit noted on exam.  Family history of aortic aneurysms - multiple family members.  Schedule aortic ultrasound to further evaluate.        Relevant Orders   US AORTA   Ear pain, right    Resolved now.  No pain.  Ear clear.  Follow.        Family history of aneurysm   Relevant Orders   US AORTA     Einar Pheasant, MD

## 2021-01-02 ENCOUNTER — Encounter: Payer: Self-pay | Admitting: Internal Medicine

## 2021-01-02 DIAGNOSIS — H9201 Otalgia, right ear: Secondary | ICD-10-CM | POA: Insufficient documentation

## 2021-01-02 NOTE — Assessment & Plan Note (Signed)
Resolved now.  No pain.  Ear clear.  Follow.

## 2021-01-02 NOTE — Assessment & Plan Note (Signed)
Abdominal bruit noted on exam.  Family history of aortic aneurysms - multiple family members.  Schedule aortic ultrasound to further evaluate.

## 2021-01-12 ENCOUNTER — Ambulatory Visit
Admission: RE | Admit: 2021-01-12 | Discharge: 2021-01-12 | Disposition: A | Discharge status: Home / Self Care | Payer: Medicare Other | Source: Ambulatory Visit | Attending: Internal Medicine | Admitting: Internal Medicine

## 2021-01-12 ENCOUNTER — Other Ambulatory Visit: Payer: Self-pay

## 2021-01-12 DIAGNOSIS — Z8249 Family history of ischemic heart disease and other diseases of the circulatory system: Secondary | ICD-10-CM | POA: Insufficient documentation

## 2021-01-12 DIAGNOSIS — R0989 Other specified symptoms and signs involving the circulatory and respiratory systems: Secondary | ICD-10-CM

## 2021-02-14 ENCOUNTER — Telehealth: Payer: Self-pay

## 2021-02-14 ENCOUNTER — Other Ambulatory Visit: Payer: Self-pay

## 2021-02-14 ENCOUNTER — Other Ambulatory Visit (INDEPENDENT_AMBULATORY_CARE_PROVIDER_SITE_OTHER): Payer: Medicare Other

## 2021-02-14 DIAGNOSIS — Z13220 Encounter for screening for lipoid disorders: Secondary | ICD-10-CM

## 2021-02-14 DIAGNOSIS — D649 Anemia, unspecified: Secondary | ICD-10-CM

## 2021-02-14 DIAGNOSIS — M810 Age-related osteoporosis without current pathological fracture: Secondary | ICD-10-CM

## 2021-02-14 LAB — CBC WITH DIFFERENTIAL/PLATELET
Basophils Absolute: 0.1 10*3/uL (ref 0.0–0.1)
Basophils Relative: 1.4 % (ref 0.0–3.0)
Eosinophils Absolute: 0.1 10*3/uL (ref 0.0–0.7)
Eosinophils Relative: 1.6 % (ref 0.0–5.0)
HCT: 37.9 % (ref 36.0–46.0)
Hemoglobin: 12.7 g/dL (ref 12.0–15.0)
Lymphocytes Relative: 29.7 % (ref 12.0–46.0)
Lymphs Abs: 1.6 10*3/uL (ref 0.7–4.0)
MCHC: 33.5 g/dL (ref 30.0–36.0)
MCV: 92.7 fl (ref 78.0–100.0)
Monocytes Absolute: 0.4 10*3/uL (ref 0.1–1.0)
Monocytes Relative: 7.6 % (ref 3.0–12.0)
Neutro Abs: 3.3 10*3/uL (ref 1.4–7.7)
Neutrophils Relative %: 59.7 % (ref 43.0–77.0)
Platelets: 286 10*3/uL (ref 150.0–400.0)
RBC: 4.09 Mil/uL (ref 3.87–5.11)
RDW: 12.9 % (ref 11.5–15.5)
WBC: 5.5 10*3/uL (ref 4.0–10.5)

## 2021-02-14 LAB — COMPREHENSIVE METABOLIC PANEL
ALT: 15 U/L (ref 0–35)
AST: 21 U/L (ref 0–37)
Albumin: 4.2 g/dL (ref 3.5–5.2)
Alkaline Phosphatase: 83 U/L (ref 39–117)
BUN: 10 mg/dL (ref 6–23)
CO2: 30 mEq/L (ref 19–32)
Calcium: 9.7 mg/dL (ref 8.4–10.5)
Chloride: 104 mEq/L (ref 96–112)
Creatinine, Ser: 0.76 mg/dL (ref 0.40–1.20)
GFR: 80.8 mL/min (ref >60.00)
Glucose, Bld: 97 mg/dL (ref 70–99)
Potassium: 4 mEq/L (ref 3.5–5.1)
Sodium: 140 mEq/L (ref 135–145)
Total Bilirubin: 0.5 mg/dL (ref 0.2–1.2)
Total Protein: 6.6 g/dL (ref 6.0–8.3)

## 2021-02-14 LAB — LIPID PANEL
Cholesterol: 222 mg/dL — ABNORMAL HIGH (ref 0–200)
HDL: 93.3 mg/dL (ref >39.00)
LDL Cholesterol: 112 mg/dL — ABNORMAL HIGH (ref 0–99)
NonHDL: 128.65
Total CHOL/HDL Ratio: 2
Triglycerides: 81 mg/dL (ref 0.0–149.0)
VLDL: 16.2 mg/dL (ref 0.0–40.0)

## 2021-02-14 LAB — VITAMIN D 25 HYDROXY (VIT D DEFICIENCY, FRACTURES): VITD: 39.6 ng/mL (ref 30.00–100.00)

## 2021-02-14 LAB — TSH: TSH: 1.54 u[IU]/mL (ref 0.35–5.50)

## 2021-02-14 NOTE — Telephone Encounter (Signed)
Orders placed for labs

## 2021-02-16 ENCOUNTER — Other Ambulatory Visit: Payer: Self-pay

## 2021-02-16 ENCOUNTER — Ambulatory Visit (INDEPENDENT_AMBULATORY_CARE_PROVIDER_SITE_OTHER): Payer: Medicare Other | Admitting: Internal Medicine

## 2021-02-16 ENCOUNTER — Encounter: Payer: Self-pay | Admitting: Internal Medicine

## 2021-02-16 ENCOUNTER — Telehealth: Payer: Self-pay | Admitting: Internal Medicine

## 2021-02-16 VITALS — BP 118/68 | HR 88 | Temp 97.5°F | Resp 16 | Ht 68.0 in | Wt 128.4 lb

## 2021-02-16 DIAGNOSIS — Z13220 Encounter for screening for lipoid disorders: Secondary | ICD-10-CM

## 2021-02-16 DIAGNOSIS — Z1211 Encounter for screening for malignant neoplasm of colon: Secondary | ICD-10-CM | POA: Diagnosis not present

## 2021-02-16 DIAGNOSIS — D649 Anemia, unspecified: Secondary | ICD-10-CM | POA: Diagnosis not present

## 2021-02-16 DIAGNOSIS — Z8249 Family history of ischemic heart disease and other diseases of the circulatory system: Secondary | ICD-10-CM

## 2021-02-16 DIAGNOSIS — M810 Age-related osteoporosis without current pathological fracture: Secondary | ICD-10-CM

## 2021-02-16 DIAGNOSIS — Z1231 Encounter for screening mammogram for malignant neoplasm of breast: Secondary | ICD-10-CM

## 2021-02-16 DIAGNOSIS — Z0000 Encounter for general adult medical examination without abnormal findings: Secondary | ICD-10-CM

## 2021-02-16 MED ORDER — MUPIROCIN 2 % EX OINT
TOPICAL_OINTMENT | CUTANEOUS | 0 refills | Status: DC
Start: 2021-02-16 — End: 2023-02-22

## 2021-02-16 MED ORDER — MUPIROCIN 2 % EX OINT
TOPICAL_OINTMENT | CUTANEOUS | 0 refills | Status: DC
Start: 2021-02-16 — End: 2021-02-16

## 2021-02-16 NOTE — Progress Notes (Signed)
Patient ID: Hannah Hart, female   DOB: 12/25/1950, 70 y.o.   MRN: [id number]   Subjective:    Patient ID: Hannah Hart, female    DOB: 22-Sep-1950, 70 y.o.   MRN: [id number]  This visit occurred during the SARS-CoV-2 public health emergency.  Safety protocols were in place, including screening questions prior to the visit, additional usage of staff PPE, and extensive cleaning of exam room while observing appropriate contact time as indicated for disinfecting solutions.    HPI Has a history of osteoporosis and family history of aneurysm.  Here today to follow up on these issues as well as for a complete physical exam.  She is doing well.  Stays active.  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  No chest pain or sob with increased activity or exertion.  No sob.  No cough or congestion.  No abdominal pain or bowel change.  Walking.    Past Medical History:  Diagnosis Date   Environmental allergies    History of abnormal Pap smear    History of chicken pox    Uterine fibroid    Past Surgical History:  Procedure Laterality Date   s/p intervention for uterine fibroids     Family History  Problem Relation Age of Onset   Hypertension Mother    Atrial fibrillation Mother    Lung cancer Father        died age 79   Cancer Maternal Uncle        mouth and throat cancer   Heart disease Other        aunt   CVA Other        aunt   Heart disease Maternal Aunt        s/p CABG   Breast cancer Neg Hx    Social History   Socioeconomic History   Marital status: Married    Spouse name: Not on file   Number of children: Not on file   Years of education: Not on file   Highest education level: Not on file  Occupational History   Not on file  Tobacco Use   Smoking status: Never   Smokeless tobacco: Never  Substance and Sexual Activity   Alcohol use: No    Alcohol/week: 0.0 standard drinks   Drug use: No   Sexual activity: Not on file  Other Topics Concern   Not on file  Social  History Narrative   Not on file   Social Determinants of Health   Financial Resource Strain: Not on file  Food Insecurity: Not on file  Transportation Needs: Not on file  Physical Activity: Not on file  Stress: Not on file  Social Connections: Not on file     Review of Systems  Constitutional:  Negative for appetite change and unexpected weight change.  HENT:  Negative for congestion, sinus pressure and sore throat.   Eyes:  Negative for pain and visual disturbance.  Respiratory:  Negative for cough, chest tightness and shortness of breath.   Cardiovascular:  Negative for chest pain, palpitations and leg swelling.  Gastrointestinal:  Negative for abdominal pain, diarrhea, nausea and vomiting.  Genitourinary:  Negative for difficulty urinating and dysuria.  Musculoskeletal:  Negative for joint swelling and myalgias.  Skin:  Negative for color change and rash.  Neurological:  Negative for dizziness, light-headedness and headaches.  Hematological:  Negative for adenopathy. Does not bruise/bleed easily.  Psychiatric/Behavioral:  Negative for agitation and dysphoric mood.       Objective:  BP 118/68   Pulse 88   Temp (!) 97.5 F (36.4 C)   Resp 16   Ht 5\' 8"  (1.727 m)   Wt 128 lb 6.4 oz (58.2 kg)   SpO2 98%   BMI 19.52 kg/m  Wt Readings from Last 3 Encounters:  02/16/21 128 lb 6.4 oz (58.2 kg)  12/29/20 126 lb 9.6 oz (57.4 kg)  02/13/20 120 lb 9.6 oz (54.7 kg)    Physical Exam Vitals reviewed.  Constitutional:      General: She is not in acute distress.    Appearance: Normal appearance. She is well-developed.  HENT:     Head: Normocephalic and atraumatic.     Right Ear: External ear normal.     Left Ear: External ear normal.  Eyes:     General: No scleral icterus.       Right eye: No discharge.        Left eye: No discharge.     Conjunctiva/sclera: Conjunctivae normal.  Neck:     Thyroid: No thyromegaly.  Cardiovascular:     Rate and Rhythm: Normal rate  and regular rhythm.  Pulmonary:     Effort: No tachypnea, accessory muscle usage or respiratory distress.     Breath sounds: Normal breath sounds. No decreased breath sounds or wheezing.  Chest:  Breasts:    Right: No inverted nipple, mass, nipple discharge or tenderness (no axillary adenopathy).     Left: No inverted nipple, mass, nipple discharge or tenderness (no axilarry adenopathy).  Abdominal:     General: Bowel sounds are normal.     Palpations: Abdomen is soft.     Tenderness: There is no abdominal tenderness.  Musculoskeletal:        General: No swelling or tenderness.     Cervical back: Neck supple. No tenderness.  Lymphadenopathy:     Cervical: No cervical adenopathy.  Skin:    Findings: No erythema or rash.  Neurological:     Mental Status: She is alert and oriented to person, place, and time.  Psychiatric:        Mood and Affect: Mood normal.        Behavior: Behavior normal.     Outpatient Encounter Medications as of 02/16/2021  Medication Sig   fexofenadine (ALLEGRA) 180 MG tablet Take 180 mg by mouth as needed.   ibuprofen (ADVIL,MOTRIN) 200 MG tablet Take 200 mg by mouth every 6 (six) hours as needed for pain.   Multiple Vitamins-Minerals (ALIVE ENERGY 50+ PO) Take by mouth.   mupirocin ointment (BACTROBAN) 2 % Apply to affected area bid   [DISCONTINUED] mupirocin ointment (BACTROBAN) 2 % Apply to affected area bid   [DISCONTINUED] mupirocin ointment (BACTROBAN) 2 % Apply to affected area bid   No facility-administered encounter medications on file as of 02/16/2021.     Lab Results  Component Value Date   WBC 5.5 02/14/2021   HGB 12.7 02/14/2021   HCT 37.9 02/14/2021   PLT 286.0 02/14/2021   GLUCOSE 97 02/14/2021   CHOL 222 (H) 02/14/2021   TRIG 81.0 02/14/2021   HDL 93.30 02/14/2021   LDLDIRECT 99.2 12/26/2012   LDLCALC 112 (H) 02/14/2021   ALT 15 02/14/2021   AST 21 02/14/2021   NA 140 02/14/2021   K 4.0 02/14/2021   CL 104 02/14/2021    CREATININE 0.76 02/14/2021   BUN 10 02/14/2021   CO2 30 02/14/2021   TSH 1.54 02/14/2021    US AORTA DUPLEX LIMITED  Result Date: 01/14/2021  CLINICAL DATA:  Family history of AAA. EXAM: ULTRASOUND OF ABDOMINAL AORTA TECHNIQUE: Ultrasound examination of the abdominal aorta and proximal common iliac arteries was performed to evaluate for aneurysm. Additional color and Doppler images of the distal aorta were obtained to document patency. COMPARISON:  None. FINDINGS: Abdominal aortic measurements as follows: Proximal:  2.6 x 2.3 cm Mid:  1.6 x 1.9 cm Distal:  1.5 x 1.7 cm Patent: Yes, peak systolic velocity is 268 cm/s Right common iliac artery: 1.1 x 1.1 cm Left common iliac artery: 0.9 x 0.8 cm IMPRESSION: No abdominal aortic aneurysm. Electronically Signed   By: Anner Crete M.D.   On: 01/14/2021 00:11       Assessment & Plan:   Problem List Items Addressed This Visit     Anemia    Follow cbc.       Relevant Orders   CBC with Differential/Platelet   Comprehensive metabolic panel   TSH   Family history of aneurysm    Recent aortic ultrasound revealed no aneurysm.       Health care maintenance    Physical today 02/16/21.  Mammogram 03/28/20 - Birads I.  Discussed colon cancer screening.  Declines colonoscopy.  Agreeable to cologuard.  The 10-year ASCVD risk score (Arnett DK, et al., 2019) is: 5.2%   Values used to calculate the score:     Age: 67 years     Sex: Female     Is Non-Hispanic African American: No     Diabetic: No     Tobacco smoker: No     Systolic Blood Pressure: 341 mmHg     Is BP treated: No     HDL Cholesterol: 93.3 mg/dL     Total Cholesterol: 222 mg/dL      Osteoporosis    Continue calcium, vitamin D and weight bearing exercise.  Has declined rx medication.    Follow.       Other Visit Diagnoses     Screening cholesterol level    -  Primary   Relevant Orders   Lipid panel   Visit for screening mammogram       Relevant Orders   MM 3D SCREEN  BREAST BILATERAL   Colon cancer screening       Relevant Orders   Cologuard        Einar Pheasant, MD

## 2021-02-16 NOTE — Assessment & Plan Note (Addendum)
Physical today 02/16/21.  Mammogram 03/28/20 - Birads I.  Discussed colon cancer screening.  Declines colonoscopy.  Agreeable to cologuard.  The 10-year ASCVD risk score (Arnett DK, et al., 2019) is: 5.2%   Values used to calculate the score:     Age: 70 years     Sex: Female     Is Non-Hispanic African American: No     Diabetic: No     Tobacco smoker: No     Systolic Blood Pressure: 604 mmHg     Is BP treated: No     HDL Cholesterol: 93.3 mg/dL     Total Cholesterol: 222 mg/dL

## 2021-02-16 NOTE — Telephone Encounter (Signed)
Please change all refills to [facility], in [place]. That is the only pharmacy she uses.

## 2021-02-27 NOTE — Assessment & Plan Note (Signed)
Recent aortic ultrasound revealed no aneurysm.  

## 2021-02-27 NOTE — Assessment & Plan Note (Addendum)
Continue calcium, vitamin D and weight bearing exercise.  Has declined rx medication.    Follow.  

## 2021-02-27 NOTE — Assessment & Plan Note (Signed)
Follow cbc.  

## 2021-03-09 LAB — COLOGUARD: COLOGUARD: NEGATIVE

## 2021-03-10 ENCOUNTER — Telehealth: Payer: Self-pay | Admitting: Internal Medicine

## 2021-03-10 NOTE — Telephone Encounter (Signed)
Pt called in stating that she didn't receive her cologuard result. Pt stated that she did the cologuard testing a week ago today. Pt would like callback.

## 2021-03-10 NOTE — Telephone Encounter (Signed)
Patient aware of results.

## 2021-03-28 ENCOUNTER — Other Ambulatory Visit: Payer: Self-pay

## 2021-03-28 ENCOUNTER — Ambulatory Visit
Admission: RE | Admit: 2021-03-28 | Discharge: 2021-03-28 | Disposition: A | Discharge status: Home / Self Care | Payer: Medicare Other | Source: Ambulatory Visit | Attending: Internal Medicine | Admitting: Internal Medicine

## 2021-03-28 DIAGNOSIS — Z1231 Encounter for screening mammogram for malignant neoplasm of breast: Secondary | ICD-10-CM | POA: Diagnosis not present

## 2021-06-30 ENCOUNTER — Telehealth: Payer: Self-pay

## 2021-06-30 NOTE — Telephone Encounter (Signed)
Patient called me back and I informed her that I wanted to schedule her medicare wellness visit and she declined the visit.  Hannah Hart,cma  ?

## 2021-06-30 NOTE — Telephone Encounter (Signed)
I called the patient and LVM for her to call back and ask for Gae Bon to schedule her annual medicare wellness visit.  Chele Cornell,cma  ?

## 2021-07-26 ENCOUNTER — Telehealth: Payer: Self-pay | Admitting: Internal Medicine

## 2021-07-26 NOTE — Telephone Encounter (Signed)
Copied from [place] [id number]. Topic: Medicare AWV ?>> Jul 26, 2021  9:51 AM Harris-Coley, Hannah Beat wrote: ?Reason for CRM: Left message for patient to schedule Annual Wellness Visit.  Please schedule with Nurse Health Advisor Denisa O'Brien-Blaney, LPN at [facility].  Please call [phone] ask for Juliann Pulse ?

## 2021-09-14 ENCOUNTER — Telehealth: Payer: Self-pay | Admitting: Internal Medicine

## 2021-09-14 NOTE — Telephone Encounter (Signed)
Copied from [place] [id number]. Topic: Medicare AWV ?>> Sep 14, 2021 10:24 AM Harris-Coley, Hannah Beat wrote: ?Reason for CRM: Left message for patient to schedule Annual Wellness Visit.  Please schedule with Nurse Health Advisor Denisa O'Brien-Blaney, LPN at [facility].  Please call [phone] ask for Juliann Pulse ?

## 2021-10-31 ENCOUNTER — Telehealth: Payer: Self-pay | Admitting: Internal Medicine

## 2021-10-31 NOTE — Telephone Encounter (Signed)
Copied from [place] [phone]. Topic: Medicare AWV >> Oct 31, 2021  1:20 PM Devoria Glassing wrote: Reason for CRM: Called patient to schedule Annual Wellness Visit.  Please schedule with Nurse Health Advisor Denisa O'Brien-Blaney, LPN at [facility].  Please call [phone] ask for [facility]

## 2021-11-18 ENCOUNTER — Ambulatory Visit (INDEPENDENT_AMBULATORY_CARE_PROVIDER_SITE_OTHER): Payer: Medicare Other

## 2021-11-18 VITALS — Ht 68.0 in | Wt 128.0 lb

## 2021-11-18 DIAGNOSIS — Z0000 Encounter for general adult medical examination without abnormal findings: Secondary | ICD-10-CM | POA: Diagnosis not present

## 2021-11-18 NOTE — Progress Notes (Signed)
Subjective:   Hannah Hart is a 71 y.o. female who presents for Medicare Annual (Subsequent) preventive examination.  Review of Systems    No ROS.  Medicare Wellness Virtual Visit.  Visual/audio telehealth visit, UTA vital signs.   See social history for additional risk factors.   Cardiac Risk Factors include: advanced age (>66mn, >>55women)     Objective:    Today's Vitals   11/18/21 1336  Weight: 128 lb (58.1 kg)  Height: '5\' 8"'$  (1.727 m)   Body mass index is 19.46 kg/m.     11/18/2021    1:33 PM 12/19/2019   11:00 AM  Advanced Directives  Does Patient Have a Medical Advance Directive? No No  Would patient like information on creating a medical advance directive? No - Patient declined Yes (MAU/Ambulatory/Procedural Areas - Information given)   Current Medications (verified) Outpatient Encounter Medications as of 11/18/2021  Medication Sig   fexofenadine (ALLEGRA) 180 MG tablet Take 180 mg by mouth as needed.   ibuprofen (ADVIL,MOTRIN) 200 MG tablet Take 200 mg by mouth every 6 (six) hours as needed for pain.   Multiple Vitamins-Minerals (ALIVE ENERGY 50+ PO) Take by mouth.   mupirocin ointment (BACTROBAN) 2 % Apply to affected area bid   No facility-administered encounter medications on file as of 11/18/2021.   Allergies (verified) Propofol and Augmentin [amoxicillin-pot clavulanate]   History: Past Medical History:  Diagnosis Date   Environmental allergies    History of abnormal Pap smear    History of chicken pox    Uterine fibroid    Past Surgical History:  Procedure Laterality Date   s/p intervention for uterine fibroids     Family History  Problem Relation Age of Onset   Hypertension Mother    Atrial fibrillation Mother    Lung cancer Father        died age 69  Cancer Maternal Uncle        mouth and throat cancer   Heart disease Other        aunt   CVA Other        aunt   Heart disease Maternal Aunt        s/p CABG   Breast cancer Neg Hx     Social History   Socioeconomic History   Marital status: Married    Spouse name: Not on file   Number of children: Not on file   Years of education: Not on file   Highest education level: Not on file  Occupational History   Not on file  Tobacco Use   Smoking status: Never   Smokeless tobacco: Never  Substance and Sexual Activity   Alcohol use: No    Alcohol/week: 0.0 standard drinks of alcohol   Drug use: No   Sexual activity: Not on file  Other Topics Concern   Not on file  Social History Narrative   Not on file   Social Determinants of Health   Financial Resource Strain: Low Risk  (11/18/2021)   Overall Financial Resource Strain (CARDIA)    Difficulty of Paying Living Expenses: Not hard at all  Food Insecurity: No Food Insecurity (11/18/2021)   Hunger Vital Sign    Worried About Running Out of Food in the Last Year: Never true    Ran Out of Food in the Last Year: Never true  Transportation Needs: No Transportation Needs (11/18/2021)   PRAPARE - Transportation    Lack of Transportation (Medical): No    Lack of  Transportation (Non-Medical): No  Physical Activity: Sufficiently Active (11/18/2021)   Exercise Vital Sign    Days of Exercise per Week: 5 days    Minutes of Exercise per Session: 30 min  Stress: No Stress Concern Present (11/18/2021)   [place]    Feeling of Stress : Not at all  Social Connections: Unknown (11/18/2021)   Social Connection and Isolation Panel [NHANES]    Frequency of Communication with Friends and Family: More than three times a week    Frequency of Social Gatherings with Friends and Family: More than three times a week    Attends Religious Services: More than 4 times per year    Active Member of [employer] or Organizations: Yes    Attends Music therapist: More than 4 times per year    Marital Status: Not on file    Tobacco Counseling Counseling given: Not  Answered  Clinical Intake: Pre-visit preparation completed: Yes        Diabetes: No  How often do you need to have someone help you when you read instructions, pamphlets, or other written materials from your doctor or pharmacy?: 1 - Never Interpreter Needed?: No    Activities of Daily Living    11/18/2021    1:36 PM  In your present state of health, do you have any difficulty performing the following activities:  Hearing? 0  Vision? 0  Difficulty concentrating or making decisions? 0  Walking or climbing stairs? 0  Dressing or bathing? 0  Doing errands, shopping? 0  Preparing Food and eating ? N  Using the Toilet? N  In the past six months, have you accidently leaked urine? N  Do you have problems with loss of bowel control? N  Managing your Medications? N  Managing your Finances? N  Housekeeping or managing your Housekeeping? N   Patient Care Team: Einar Pheasant, MD as PCP - General (Internal Medicine)  Indicate any recent Medical Services you may have received from other than [facility] providers in the past year (date may be approximate).     Assessment:   This is a routine wellness examination for Hannah Hart.  Virtual Visit via Telephone Note  I connected with  Hannah Hart on 11/18/21 at  1:30 PM EDT by telephone and verified that I am speaking with the correct person using two identifiers.  Persons participating in the virtual visit: patient/Nurse Health Advisor   I discussed the limitations of performing an evaluation and management service by telehealth. We continued and completed visit with audio only. Some vital signs may be absent or patient reported.   Hearing/Vision screen Hearing Screening - Comments:: Patient is able to hear conversational tones without difficulty. No issues reported. Vision Screening - Comments:: Followed by [facility]  Cataract extraction, bilateral   Dietary issues and exercise activities discussed: Current Exercise  Habits: Home exercise routine, Time (Minutes): 30, Frequency (Times/Week): 5, Weekly Exercise (Minutes/Week): 150, Intensity: Mild Healthy diet Good water intake   Goals Addressed             This Visit's Progress    Follow up with Primary Care Provider       As needed and maintain healthy lifestyle.       Depression Screen    11/18/2021    1:45 PM 12/29/2020    1:28 PM 12/19/2019   10:46 AM 01/20/2019    8:41 AM 01/08/2017    8:18 AM 01/06/2016  7:55 AM  PHQ 2/9 Scores  PHQ - 2 Score 0 0 0 0 0 0    Fall Risk    11/18/2021    1:49 PM 12/29/2020    1:28 PM 12/19/2019   10:50 AM 09/26/2019    8:10 AM 01/20/2019    8:40 AM  [place] in the past year? 0 0 0 0 0  Number falls in past yr: 0 0 0    Injury with Fall?  0 0    Risk for fall due to :  No Fall Risks     Follow up Falls evaluation completed Falls evaluation completed Falls evaluation completed Falls evaluation completed Falls evaluation completed    [place]: Home free of loose throw rugs in walkways, pet beds, electrical cords, etc? Yes  Adequate lighting in your home to reduce risk of falls? Yes   ASSISTIVE DEVICES UTILIZED TO PREVENT FALLS: Life alert? No  Use of a cane, walker or w/c? No   TIMED UP AND GO: Was the test performed? No .   Cognitive Function:  Patient is alert and oriented x3.       11/18/2021    1:49 PM 12/19/2019   11:00 AM  6CIT Screen  What Year? 0 points 0 points  What month? 0 points 0 points  What time? 0 points 0 points  Count back from 20 0 points   Months in reverse 0 points   Repeat phrase 0 points   Total Score 0 points     Immunizations Immunization History  Administered Date(s) Administered   Tdap 12/26/2012   Pneumococcal vaccine status: Declined,  Education has been provided regarding the importance of this vaccine but patient still declined. Advised may receive this vaccine at local pharmacy or Health Dept. Aware to  provide a copy of the vaccination record if obtained from local pharmacy or Health Dept. Verbalized acceptance and understanding. Discontinued per patient.   Covid-19 vaccine status: Declined, Education has been provided regarding the importance of this vaccine but patient still declined. Advised may receive this vaccine at local pharmacy or Health Dept.or vaccine clinic. Aware to provide a copy of the vaccination record if obtained from local pharmacy or Health Dept. Verbalized acceptance and understanding. Discontinued per patient.   Shingrix vaccine- discontinued per patient.   Screening Tests Health Maintenance  Topic Date Due   Hepatitis C Screening  12/30/2021 (Originally 03/20/1969)   COLONOSCOPY (Pts 45-37yr Insurance coverage will need to be confirmed)  02/16/2022 (Originally 03/19/1998)   INFLUENZA VACCINE  11/29/2021   TETANUS/TDAP  12/27/2022   MAMMOGRAM  03/29/2023   DEXA SCAN  Completed   HPV VACCINES  Aged Out   Pneumonia Vaccine 69 Years old  Discontinued   COVID-19 Vaccine  Discontinued   Zoster Vaccines- Shingrix  Discontinued   Health Maintenance There are no preventive care reminders to display for this patient.  Lung Cancer Screening: (Low Dose CT Chest recommended if Age 69-80years, 30 pack-year currently smoking OR have quit w/in 15years.) does not qualify.   Hepatitis C Screening: deferred per patient.   Vision Screening: Recommended annual ophthalmology exams for early detection of glaucoma and other disorders of the eye.  Dental Screening: Recommended annual dental exams for proper oral hygiene  Community Resource Referral / Chronic Care Management: CRR required this visit?  No   CCM required this visit?  No      Plan:   Keep all routine  maintenance appointments.   I have personally reviewed and noted the following in the patient's chart:   Medical and social history Use of alcohol, tobacco or illicit drugs  Current medications and supplements  including opioid prescriptions.  Functional ability and status Nutritional status Physical activity Advanced directives List of other physicians Hospitalizations, surgeries, and ER visits in previous 12 months Vitals Screenings to include cognitive, depression, and falls Referrals and appointments  In addition, I have reviewed and discussed with patient certain preventive protocols, quality metrics, and best practice recommendations. A written personalized care plan for preventive services as well as general preventive health recommendations were provided to patient.     Varney Biles, LPN   2/99/3716

## 2021-11-18 NOTE — Patient Instructions (Addendum)
  Hannah Hart , Thank you for taking time to come for your Medicare Wellness Visit. I appreciate your ongoing commitment to your health goals. Please review the following plan we discussed and let me know if I can assist you in the future.   These are the goals we discussed:  Goals      Follow up with Primary Care Provider     As needed and maintain healthy lifestyle.        This is a list of the screening recommended for you and due dates:  Health Maintenance  Topic Date Due   Hepatitis C Screening: USPSTF Recommendation to screen - Ages 18-79 yo.  12/30/2021*   Colon Cancer Screening  02/16/2022*   Flu Shot  11/29/2021   Tetanus Vaccine  12/27/2022   Mammogram  03/29/2023   DEXA scan (bone density measurement)  Completed   HPV Vaccine  Aged Out   Pneumonia Vaccine  Discontinued   COVID-19 Vaccine  Discontinued   Zoster (Shingles) Vaccine  Discontinued  *Topic was postponed. The date shown is not the original due date.

## 2022-01-26 ENCOUNTER — Encounter: Payer: Self-pay | Admitting: Internal Medicine

## 2022-01-26 ENCOUNTER — Ambulatory Visit (INDEPENDENT_AMBULATORY_CARE_PROVIDER_SITE_OTHER): Payer: Medicare Other | Admitting: Internal Medicine

## 2022-01-26 VITALS — BP 118/56 | HR 89 | Temp 98.2°F | Ht 68.0 in | Wt 127.8 lb

## 2022-01-26 DIAGNOSIS — N3000 Acute cystitis without hematuria: Secondary | ICD-10-CM

## 2022-01-26 DIAGNOSIS — R300 Dysuria: Secondary | ICD-10-CM

## 2022-01-26 DIAGNOSIS — N3001 Acute cystitis with hematuria: Secondary | ICD-10-CM | POA: Diagnosis not present

## 2022-01-26 MED ORDER — CIPROFLOXACIN HCL 500 MG PO TABS: 500.0000 mg | ORAL_TABLET | Freq: Two times a day (BID) | ORAL | 0 refills | Status: AC

## 2022-01-26 MED ORDER — PHENAZOPYRIDINE HCL 200 MG PO TABS: 200.0000 mg | ORAL_TABLET | Freq: Three times a day (TID) | ORAL | 0 refills | Status: DC | PRN

## 2022-01-26 NOTE — Patient Instructions (Signed)
Urinary Tract Infection, Adult  A urinary tract infection (UTI) is an infection of any part of the urinary tract. The urinary tract includes the kidneys, ureters, bladder, and urethra. These organs make, store, and get rid of urine in the body. An upper UTI affects the ureters and kidneys. A lower UTI affects the bladder and urethra. What are the causes? Most urinary tract infections are caused by bacteria in your genital area around your urethra, where urine leaves your body. These bacteria grow and cause inflammation of your urinary tract. What increases the risk? You are more likely to develop this condition if: You have a urinary catheter that stays in place. You are not able to control when you urinate or have a bowel movement (incontinence). You are female and you: Use a spermicide or diaphragm for birth control. Have low estrogen levels. Are pregnant. You have certain genes that increase your risk. You are sexually active. You take antibiotic medicines. You have a condition that causes your flow of urine to slow down, such as: An enlarged prostate, if you are female. Blockage in your urethra. A kidney stone. A nerve condition that affects your bladder control (neurogenic bladder). Not getting enough to drink, or not urinating often. You have certain medical conditions, such as: Diabetes. A weak disease-fighting system (immunesystem). Sickle cell disease. Gout. Spinal cord injury. What are the signs or symptoms? Symptoms of this condition include: Needing to urinate right away (urgency). Frequent urination. This may include small amounts of urine each time you urinate. Pain or burning with urination. Blood in the urine. Urine that smells bad or unusual. Trouble urinating. Cloudy urine. Vaginal discharge, if you are female. Pain in the abdomen or the lower back. You may also have: Vomiting or a decreased appetite. Confusion. Irritability or tiredness. A fever or  chills. Diarrhea. The first symptom in older adults may be confusion. In some cases, they may not have any symptoms until the infection has worsened. How is this diagnosed? This condition is diagnosed based on your medical history and a physical exam. You may also have other tests, including: Urine tests. Blood tests. Tests for STIs (sexually transmitted infections). If you have had more than one UTI, a cystoscopy or imaging studies may be done to determine the cause of the infections. How is this treated? Treatment for this condition includes: Antibiotic medicine. Over-the-counter medicines to treat discomfort. Drinking enough water to stay hydrated. If you have frequent infections or have other conditions such as a kidney stone, you may need to see a health care provider who specializes in the urinary tract (urologist). In rare cases, urinary tract infections can cause sepsis. Sepsis is a life-threatening condition that occurs when the body responds to an infection. Sepsis is treated in the hospital with IV antibiotics, fluids, and other medicines. Follow these instructions at home:  Medicines Take over-the-counter and prescription medicines only as told by your health care provider. If you were prescribed an antibiotic medicine, take it as told by your health care provider. Do not stop using the antibiotic even if you start to feel better. General instructions Make sure you: Empty your bladder often and completely. Do not hold urine for long periods of time. Empty your bladder after sex. Wipe from front to back after urinating or having a bowel movement if you are female. Use each tissue only one time when you wipe. Drink enough fluid to keep your urine pale yellow. Keep all follow-up visits. This is important. Contact a health   care provider if: Your symptoms do not get better after 1-2 days. Your symptoms go away and then return. Get help right away if: You have severe pain in  your back or your lower abdomen. You have a fever or chills. You have nausea or vomiting. Summary A urinary tract infection (UTI) is an infection of any part of the urinary tract, which includes the kidneys, ureters, bladder, and urethra. Most urinary tract infections are caused by bacteria in your genital area. Treatment for this condition often includes antibiotic medicines. If you were prescribed an antibiotic medicine, take it as told by your health care provider. Do not stop using the antibiotic even if you start to feel better. Keep all follow-up visits. This is important. This information is not intended to replace advice given to you by your health care provider. Make sure you discuss any questions you have with your health care provider. Document Revised: 11/28/2019 Document Reviewed: 11/28/2019 Elsevier Patient Education  2023 Elsevier Inc.  

## 2022-01-26 NOTE — Progress Notes (Signed)
Chief Complaint  Patient presents with   Urinary Tract Infection    Pt had urgency and burning with urination with some blood    F/u  1. Yesterday urgency, lower ab pain and burning and blood tinged urine or on tissue this happened after went to [place] drinking tea 2 glasses and thought was this h/o UTI     Review of Systems  Constitutional:  Negative for weight loss.  HENT:  Negative for hearing loss.   Eyes:  Negative for blurred vision.  Respiratory:  Negative for shortness of breath.   Cardiovascular:  Negative for chest pain.  Gastrointestinal:  Negative for abdominal pain and blood in stool.  Genitourinary:  Negative for dysuria.  Musculoskeletal:  Negative for falls and joint pain.  Skin:  Negative for rash.  Neurological:  Negative for headaches.  Psychiatric/Behavioral:  Negative for depression.    Past Medical History:  Diagnosis Date   COVID-19    Environmental allergies    History of abnormal Pap smear    History of chicken pox    Nasal sore    txed Dr. Jarrett Soho and bactrim help   Uterine fibroid    UTI (urinary tract infection)    Past Surgical History:  Procedure Laterality Date   s/p intervention for uterine fibroids     Family History  Problem Relation Age of Onset   Hypertension Mother    Atrial fibrillation Mother    Lung cancer Father        died age 46   Cancer Maternal Uncle        mouth and throat cancer   Heart disease Other        aunt   CVA Other        aunt   Heart disease Maternal Aunt        s/p CABG   Breast cancer Neg Hx    Social History   Socioeconomic History   Marital status: Married    Spouse name: Not on file   Number of children: Not on file   Years of education: Not on file   Highest education level: Not on file  Occupational History   Not on file  Tobacco Use   Smoking status: Never   Smokeless tobacco: Never  Substance and Sexual Activity   Alcohol use: No    Alcohol/week: 0.0 standard drinks  of alcohol   Drug use: No   Sexual activity: Not on file  Other Topics Concern   Not on file  Social History Narrative   Not on file   Social Determinants of Health   Financial Resource Strain: Low Risk  (11/18/2021)   Overall Financial Resource Strain (CARDIA)    Difficulty of Paying Living Expenses: Not hard at all  Food Insecurity: No Food Insecurity (11/18/2021)   Hunger Vital Sign    Worried About Running Out of Food in the Last Year: Never true    Ran Out of Food in the Last Year: Never true  Transportation Needs: No Transportation Needs (11/18/2021)   PRAPARE - Hydrologist (Medical): No    Lack of Transportation (Non-Medical): No  Physical Activity: Sufficiently Active (11/18/2021)   Exercise Vital Sign    Days of Exercise per Week: 5 days    Minutes of Exercise per Session: 30 min  Stress: No Stress Concern Present (11/18/2021)   [place]    Feeling of Stress :  Not at all  Social Connections: Unknown (11/18/2021)   Social Connection and Isolation Panel [NHANES]    Frequency of Communication with Friends and Family: More than three times a week    Frequency of Social Gatherings with Friends and Family: More than three times a week    Attends Religious Services: More than 4 times per year    Active Member of [employer] or Organizations: Yes    Attends Archivist Meetings: More than 4 times per year    Marital Status: Not on file  Intimate Partner Violence: Not At Risk (11/18/2021)   Humiliation, Afraid, Rape, and Kick questionnaire    Fear of Current or Ex-Partner: No    Emotionally Abused: No    Physically Abused: No    Sexually Abused: No   Current Meds  Medication Sig   ciprofloxacin (CIPRO) 500 MG tablet Take 1 tablet (500 mg total) by mouth 2 (two) times daily for 5 days. With food   fexofenadine (ALLEGRA) 180 MG tablet Take 180 mg by mouth as needed.   ibuprofen  (ADVIL,MOTRIN) 200 MG tablet Take 200 mg by mouth every 6 (six) hours as needed for pain.   Multiple Vitamins-Minerals (ALIVE ENERGY 50+ PO) Take by mouth.   mupirocin ointment (BACTROBAN) 2 % Apply to affected area bid   phenazopyridine (PYRIDIUM) 200 MG tablet Take 1 tablet (200 mg total) by mouth 3 (three) times daily as needed for pain.   Allergies  Allergen Reactions   Propofol Nausea Only   Augmentin [Amoxicillin-Pot Clavulanate] Rash   No results found for this or any previous visit (from the past 2160 hour(s)). Objective  Body mass index is 19.43 kg/m. Wt Readings from Last 3 Encounters:  01/26/22 127 lb 12.8 oz (58 kg)  11/18/21 128 lb (58.1 kg)  02/16/21 128 lb 6.4 oz (58.2 kg)   Temp Readings from Last 3 Encounters:  01/26/22 98.2 F (36.8 C) (Oral)  02/16/21 (!) 97.5 F (36.4 C)  12/29/20 97.8 F (36.6 C)   BP Readings from Last 3 Encounters:  01/26/22 (!) 118/56  02/16/21 118/68  12/29/20 138/76   Pulse Readings from Last 3 Encounters:  01/26/22 89  02/16/21 88  12/29/20 63    Physical Exam Vitals and nursing note reviewed.  Constitutional:      Appearance: Normal appearance. She is well-developed and well-groomed.  HENT:     Head: Normocephalic and atraumatic.  Eyes:     Conjunctiva/sclera: Conjunctivae normal.     Pupils: Pupils are equal, round, and reactive to light.  Cardiovascular:     Rate and Rhythm: Normal rate and regular rhythm.     Heart sounds: Normal heart sounds. No murmur heard. Pulmonary:     Effort: Pulmonary effort is normal.     Breath sounds: Normal breath sounds.  Abdominal:     General: Abdomen is flat. Bowel sounds are normal.     Tenderness: There is no abdominal tenderness.  Musculoskeletal:        General: No tenderness.  Skin:    General: Skin is warm and dry.  Neurological:     General: No focal deficit present.     Mental Status: She is alert and oriented to person, place, and time. Mental status is at baseline.      Cranial Nerves: Cranial nerves 2-12 are intact.     Motor: Motor function is intact.     Coordination: Coordination is intact.     Gait: Gait is intact.  Psychiatric:  Attention and Perception: Attention and perception normal.        Mood and Affect: Mood and affect normal.        Speech: Speech normal.        Behavior: Behavior normal. Behavior is cooperative.        Thought Content: Thought content normal.        Cognition and Memory: Cognition and memory normal.        Judgment: Judgment normal.     Assessment  Plan  Acute cystitis with hematuria with dyuria - Plan: Urinalysis, Routine w reflex microscopic, Urine Culture, ciprofloxacin (CIPRO) 500 MG tablet bid x 5 days Pyridium 200 mg tid prn  Water    Provider: Dr. Olivia Mackie McLean-Scocuzza-Internal Medicine

## 2022-01-29 LAB — URINALYSIS, ROUTINE W REFLEX MICROSCOPIC
Bilirubin Urine: NEGATIVE
Glucose, UA: NEGATIVE
Hyaline Cast: NONE SEEN /LPF
Ketones, ur: NEGATIVE
Nitrite: NEGATIVE
Specific Gravity, Urine: 1.012 (ref 1.001–1.035)
Squamous Epithelial / HPF: NONE SEEN /HPF (ref <=5)
pH: <=5 (ref 5.0–8.0)

## 2022-01-29 LAB — MICROSCOPIC MESSAGE

## 2022-01-29 LAB — URINE CULTURE
MICRO NUMBER:: 13981478
SPECIMEN QUALITY:: ADEQUATE

## 2022-01-30 ENCOUNTER — Telehealth: Payer: Self-pay | Admitting: Internal Medicine

## 2022-01-30 NOTE — Telephone Encounter (Signed)
Pt called about urine results. Pt would like to be called

## 2022-01-31 NOTE — Telephone Encounter (Signed)
Patient came into the office asking for urine results. Dr Audrie Gallus note was read. Patient had no questions.

## 2022-02-15 ENCOUNTER — Other Ambulatory Visit (INDEPENDENT_AMBULATORY_CARE_PROVIDER_SITE_OTHER): Payer: Medicare Other

## 2022-02-15 DIAGNOSIS — D649 Anemia, unspecified: Secondary | ICD-10-CM

## 2022-02-15 DIAGNOSIS — Z13220 Encounter for screening for lipoid disorders: Secondary | ICD-10-CM

## 2022-02-15 LAB — COMPREHENSIVE METABOLIC PANEL
ALT: 16 U/L (ref 0–35)
AST: 22 U/L (ref 0–37)
Albumin: 4.3 g/dL (ref 3.5–5.2)
Alkaline Phosphatase: 79 U/L (ref 39–117)
BUN: 15 mg/dL (ref 6–23)
CO2: 29 mEq/L (ref 19–32)
Calcium: 9.9 mg/dL (ref 8.4–10.5)
Chloride: 102 mEq/L (ref 96–112)
Creatinine, Ser: 0.71 mg/dL (ref 0.40–1.20)
GFR: 87.06 mL/min (ref >60.00)
Glucose, Bld: 97 mg/dL (ref 70–99)
Potassium: 4.3 mEq/L (ref 3.5–5.1)
Sodium: 138 mEq/L (ref 135–145)
Total Bilirubin: 0.6 mg/dL (ref 0.2–1.2)
Total Protein: 6.9 g/dL (ref 6.0–8.3)

## 2022-02-15 LAB — CBC WITH DIFFERENTIAL/PLATELET
Basophils Absolute: 0.1 10*3/uL (ref 0.0–0.1)
Basophils Relative: 1.2 % (ref 0.0–3.0)
Eosinophils Absolute: 0.1 10*3/uL (ref 0.0–0.7)
Eosinophils Relative: 1.7 % (ref 0.0–5.0)
HCT: 37.7 % (ref 36.0–46.0)
Hemoglobin: 12.7 g/dL (ref 12.0–15.0)
Lymphocytes Relative: 29.1 % (ref 12.0–46.0)
Lymphs Abs: 1.9 10*3/uL (ref 0.7–4.0)
MCHC: 33.6 g/dL (ref 30.0–36.0)
MCV: 92.3 fl (ref 78.0–100.0)
Monocytes Absolute: 0.5 10*3/uL (ref 0.1–1.0)
Monocytes Relative: 8.1 % (ref 3.0–12.0)
Neutro Abs: 3.9 10*3/uL (ref 1.4–7.7)
Neutrophils Relative %: 59.9 % (ref 43.0–77.0)
Platelets: 304 10*3/uL (ref 150.0–400.0)
RBC: 4.09 Mil/uL (ref 3.87–5.11)
RDW: 13.1 % (ref 11.5–15.5)
WBC: 6.5 10*3/uL (ref 4.0–10.5)

## 2022-02-15 LAB — LIPID PANEL
Cholesterol: 220 mg/dL — ABNORMAL HIGH (ref 0–200)
HDL: 91.7 mg/dL (ref >39.00)
LDL Cholesterol: 117 mg/dL — ABNORMAL HIGH (ref 0–99)
NonHDL: 128.74
Total CHOL/HDL Ratio: 2
Triglycerides: 57 mg/dL (ref 0.0–149.0)
VLDL: 11.4 mg/dL (ref 0.0–40.0)

## 2022-02-15 LAB — TSH: TSH: 1.84 u[IU]/mL (ref 0.35–5.50)

## 2022-02-17 ENCOUNTER — Ambulatory Visit (INDEPENDENT_AMBULATORY_CARE_PROVIDER_SITE_OTHER): Payer: Medicare Other | Admitting: Internal Medicine

## 2022-02-17 ENCOUNTER — Encounter: Payer: Self-pay | Admitting: Internal Medicine

## 2022-02-17 VITALS — BP 121/64 | HR 97 | Temp 98.2°F | Ht 68.0 in | Wt 128.0 lb

## 2022-02-17 DIAGNOSIS — Z862 Personal history of diseases of the blood and blood-forming organs and certain disorders involving the immune mechanism: Secondary | ICD-10-CM

## 2022-02-17 DIAGNOSIS — Z13220 Encounter for screening for lipoid disorders: Secondary | ICD-10-CM

## 2022-02-17 DIAGNOSIS — Z8249 Family history of ischemic heart disease and other diseases of the circulatory system: Secondary | ICD-10-CM | POA: Diagnosis not present

## 2022-02-17 DIAGNOSIS — Z0000 Encounter for general adult medical examination without abnormal findings: Secondary | ICD-10-CM

## 2022-02-17 DIAGNOSIS — Z1231 Encounter for screening mammogram for malignant neoplasm of breast: Secondary | ICD-10-CM

## 2022-02-17 DIAGNOSIS — M810 Age-related osteoporosis without current pathological fracture: Secondary | ICD-10-CM | POA: Diagnosis not present

## 2022-02-17 NOTE — Progress Notes (Signed)
Patient ID: Hannah Hart, female   DOB: Apr 12, 1951, 71 y.o.   MRN: [id number]   Subjective:    Patient ID: Hannah Hart, female    DOB: [birth year], 71 y.o.   MRN: [id number]   HPI With past history of osteoporosis.  In today to follow up on this as well as for a complete physical exam.  Doing well.  Stays active.  No chest pain or sob reported.  No cough or congestion.  No acid reflux or abdominal pain reported.  Saw ENT - nasal lesion.  Bactroban.  Better.     Past Medical History:  Diagnosis Date   COVID-19    Environmental allergies    History of abnormal Pap smear    History of chicken pox    Nasal sore    txed Dr. Jarrett Soho and bactrim help   Uterine fibroid    UTI (urinary tract infection)    Past Surgical History:  Procedure Laterality Date   s/p intervention for uterine fibroids     Family History  Problem Relation Age of Onset   Hypertension Mother    Atrial fibrillation Mother    Lung cancer Father        died age 29   Cancer Maternal Uncle        mouth and throat cancer   Heart disease Other        aunt   CVA Other        aunt   Heart disease Maternal Aunt        s/p CABG   Breast cancer Neg Hx    Social History   Socioeconomic History   Marital status: Married    Spouse name: Not on file   Number of children: Not on file   Years of education: Not on file   Highest education level: Not on file  Occupational History   Not on file  Tobacco Use   Smoking status: Never   Smokeless tobacco: Never  Substance and Sexual Activity   Alcohol use: No    Alcohol/week: 0.0 standard drinks of alcohol   Drug use: No   Sexual activity: Not on file  Other Topics Concern   Not on file  Social History Narrative   Not on file   Social Determinants of Health   Financial Resource Strain: Low Risk  (11/18/2021)   Overall Financial Resource Strain (CARDIA)    Difficulty of Paying Living Expenses: Not hard at all  Food Insecurity: No Food Insecurity  (11/18/2021)   Hunger Vital Sign    Worried About Running Out of Food in the Last Year: Never true    Ran Out of Food in the Last Year: Never true  Transportation Needs: No Transportation Needs (11/18/2021)   PRAPARE - Hydrologist (Medical): No    Lack of Transportation (Non-Medical): No  Physical Activity: Sufficiently Active (11/18/2021)   Exercise Vital Sign    Days of Exercise per Week: 5 days    Minutes of Exercise per Session: 30 min  Stress: No Stress Concern Present (11/18/2021)   [place]    Feeling of Stress : Not at all  Social Connections: Unknown (11/18/2021)   Social Connection and Isolation Panel [NHANES]    Frequency of Communication with Friends and Family: More than three times a week    Frequency of Social Gatherings with Friends and Family: More than three times a week  Attends Religious Services: More than 4 times per year    Active Member of Clubs or Organizations: Yes    Attends Archivist Meetings: More than 4 times per year    Marital Status: Not on file     Review of Systems  Constitutional:  Negative for appetite change and unexpected weight change.  HENT:  Negative for congestion, sinus pressure and sore throat.   Eyes:  Negative for pain and visual disturbance.  Respiratory:  Negative for cough, chest tightness and shortness of breath.   Cardiovascular:  Negative for chest pain, palpitations and leg swelling.  Gastrointestinal:  Negative for abdominal pain, diarrhea, nausea and vomiting.  Genitourinary:  Negative for difficulty urinating and dysuria.  Musculoskeletal:  Negative for joint swelling and myalgias.  Skin:  Negative for color change and rash.  Neurological:  Negative for dizziness and headaches.  Hematological:  Negative for adenopathy. Does not bruise/bleed easily.  Psychiatric/Behavioral:  Negative for agitation and dysphoric mood.         Objective:     BP 121/64 (BP Location: Left Arm, Patient Position: Sitting, Cuff Size: Normal)   Pulse 97   Temp 98.2 F (36.8 C) (Oral)   Ht '5\' 8"'$  (1.727 m)   Wt 128 lb (58.1 kg)   SpO2 98%   BMI 19.46 kg/m  Wt Readings from Last 3 Encounters:  02/17/22 128 lb (58.1 kg)  01/26/22 127 lb 12.8 oz (58 kg)  11/18/21 128 lb (58.1 kg)    Physical Exam Vitals reviewed.  Constitutional:      General: She is not in acute distress.    Appearance: Normal appearance. She is well-developed.  HENT:     Head: Normocephalic and atraumatic.     Right Ear: External ear normal.     Left Ear: External ear normal.  Eyes:     General: No scleral icterus.       Right eye: No discharge.        Left eye: No discharge.     Conjunctiva/sclera: Conjunctivae normal.  Neck:     Thyroid: No thyromegaly.  Cardiovascular:     Rate and Rhythm: Normal rate and regular rhythm.  Pulmonary:     Effort: No tachypnea, accessory muscle usage or respiratory distress.     Breath sounds: Normal breath sounds. No decreased breath sounds or wheezing.  Chest:  Breasts:    Right: No inverted nipple, mass, nipple discharge or tenderness (no axillary adenopathy).     Left: No inverted nipple, mass, nipple discharge or tenderness (no axilarry adenopathy).  Abdominal:     General: Bowel sounds are normal.     Palpations: Abdomen is soft.     Tenderness: There is no abdominal tenderness.  Musculoskeletal:        General: No swelling or tenderness.     Cervical back: Neck supple.  Lymphadenopathy:     Cervical: No cervical adenopathy.  Skin:    Findings: No erythema or rash.  Neurological:     Mental Status: She is alert and oriented to person, place, and time.  Psychiatric:        Mood and Affect: Mood normal.        Behavior: Behavior normal.      Outpatient Encounter Medications as of 02/17/2022  Medication Sig   fexofenadine (ALLEGRA) 180 MG tablet Take 180 mg by mouth as needed.    ibuprofen (ADVIL,MOTRIN) 200 MG tablet Take 200 mg by mouth every 6 (six) hours as needed  for pain.   Multiple Vitamins-Minerals (ALIVE ENERGY 50+ PO) Take by mouth.   mupirocin ointment (BACTROBAN) 2 % Apply to affected area bid   [DISCONTINUED] phenazopyridine (PYRIDIUM) 200 MG tablet Take 1 tablet (200 mg total) by mouth 3 (three) times daily as needed for pain. (Patient not taking: Reported on 02/17/2022)   No facility-administered encounter medications on file as of 02/17/2022.     Lab Results  Component Value Date   WBC 6.5 02/15/2022   HGB 12.7 02/15/2022   HCT 37.7 02/15/2022   PLT 304.0 02/15/2022   GLUCOSE 97 02/15/2022   CHOL 220 (H) 02/15/2022   TRIG 57.0 02/15/2022   HDL 91.70 02/15/2022   LDLDIRECT 99.2 12/26/2012   LDLCALC 117 (H) 02/15/2022   ALT 16 02/15/2022   AST 22 02/15/2022   NA 138 02/15/2022   K 4.3 02/15/2022   CL 102 02/15/2022   CREATININE 0.71 02/15/2022   BUN 15 02/15/2022   CO2 29 02/15/2022   TSH 1.84 02/15/2022    MM 3D SCREEN BREAST BILATERAL  Result Date: 03/28/2021 CLINICAL DATA:  Screening. EXAM: DIGITAL SCREENING BILATERAL MAMMOGRAM WITH TOMOSYNTHESIS AND CAD TECHNIQUE: Bilateral screening digital craniocaudal and mediolateral oblique mammograms were obtained. Bilateral screening digital breast tomosynthesis was performed. The images were evaluated with computer-aided detection. COMPARISON:  Previous exam(s). ACR Breast Density Category c: The breast tissue is heterogeneously dense, which may obscure small masses. FINDINGS: There are no findings suspicious for malignancy. IMPRESSION: No mammographic evidence of malignancy. A result letter of this screening mammogram will be mailed directly to the patient. RECOMMENDATION: Screening mammogram in one year. (Code:SM-B-01Y) BI-RADS CATEGORY  1: Negative. Electronically Signed   By: Marin Olp M.D.   On: 03/28/2021 09:34      Assessment & Plan:   Problem List Items Addressed This Visit      Family history of aneurysm    Recent aortic ultrasound revealed no aneurysm.       Health care maintenance    Physical today 02/16/22.  Mammogram 03/28/21 - Birads I.  Schedule f/u mammogram.  Declined colonoscopy previously.  cologuard 03/2021 - negative.   Discussed bone density.  Wants to hold.   The 10-year ASCVD risk score (Arnett DK, et al., 2019) is: 5.9%   Values used to calculate the score:     Age: 74 years     Sex: Female     Is Non-Hispanic African American: No     Diabetic: No     Tobacco smoker: No     Systolic Blood Pressure: 992 mmHg     Is BP treated: No     HDL Cholesterol: 91.7 mg/dL     Total Cholesterol: 220 mg/dL  Follow lipid panel.       History of anemia    Follow cbc.       Relevant Orders   CBC with Differential/Platelet   TSH   CBC with Differential/Platelet   TSH   Osteoporosis    Continue calcium, vitamin D and weight bearing exercise.  Has declined rx medication.    Follow.       Relevant Orders   Comprehensive metabolic panel   VITAMIN D 25 Hydroxy (Vit-D Deficiency, Fractures)   Comprehensive metabolic panel   Other Visit Diagnoses     Encounter for screening mammogram for malignant neoplasm of breast    -  Primary   Relevant Orders   MM 3D SCREEN BREAST BILATERAL   Screening cholesterol level  Relevant Orders   Lipid panel   Lipid panel        Einar Pheasant, MD

## 2022-02-17 NOTE — Assessment & Plan Note (Addendum)
Physical today 02/16/22.  Mammogram 03/28/21 - Birads I.  Schedule f/u mammogram.  Declined colonoscopy previously.  cologuard 03/2021 - negative.   Discussed bone density.  Wants to hold.   The 10-year ASCVD risk score (Arnett DK, et al., 2019) is: 5.9%   Values used to calculate the score:     Age: 71 years     Sex: Female     Is Non-Hispanic African American: No     Diabetic: No     Tobacco smoker: No     Systolic Blood Pressure: 443 mmHg     Is BP treated: No     HDL Cholesterol: 91.7 mg/dL     Total Cholesterol: 220 mg/dL  Follow lipid panel.

## 2022-02-17 NOTE — Patient Instructions (Signed)
Mammogram Thursday 03/30/22 at 9:00

## 2022-02-26 ENCOUNTER — Encounter: Payer: Self-pay | Admitting: Internal Medicine

## 2022-02-26 NOTE — Assessment & Plan Note (Signed)
Follow cbc.  

## 2022-02-26 NOTE — Assessment & Plan Note (Signed)
Continue calcium, vitamin D and weight bearing exercise.  Has declined rx medication.    Follow.

## 2022-02-26 NOTE — Assessment & Plan Note (Signed)
Recent aortic ultrasound revealed no aneurysm.

## 2022-03-30 ENCOUNTER — Ambulatory Visit
Admission: RE | Admit: 2022-03-30 | Discharge: 2022-03-30 | Disposition: A | Discharge status: Home / Self Care | Payer: Medicare Other | Source: Ambulatory Visit | Attending: Internal Medicine | Admitting: Internal Medicine

## 2022-03-30 DIAGNOSIS — Z1231 Encounter for screening mammogram for malignant neoplasm of breast: Secondary | ICD-10-CM | POA: Insufficient documentation

## 2022-07-31 ENCOUNTER — Encounter: Payer: Self-pay | Admitting: Internal Medicine

## 2022-07-31 ENCOUNTER — Ambulatory Visit (INDEPENDENT_AMBULATORY_CARE_PROVIDER_SITE_OTHER): Payer: Medicare Other | Admitting: Internal Medicine

## 2022-07-31 VITALS — BP 128/70 | HR 80 | Temp 98.3°F | Resp 16 | Ht 68.0 in | Wt 127.0 lb

## 2022-07-31 DIAGNOSIS — R1032 Left lower quadrant pain: Secondary | ICD-10-CM | POA: Diagnosis not present

## 2022-07-31 DIAGNOSIS — R109 Unspecified abdominal pain: Secondary | ICD-10-CM | POA: Insufficient documentation

## 2022-07-31 NOTE — Progress Notes (Unsigned)
Subjective:    Patient ID: Hannah Hart, female    DOB: 1950-11-20, 72 y.o.   MRN: [id number]  Patient here for  Chief Complaint  Patient presents with   Abdominal Pain    HPI Work in appt.  Noticed starting mid to late January - pain lower abdomen - LLQ - left midline.  Has been intermittent.  Noticed more in pm.  Has been eating.  No nausea or vomiting.  No bowel change.  No dysuria or hematuria.  No vaginal complaints.  No pain currently.     Past Medical History:  Diagnosis Date   COVID-19    Environmental allergies    History of abnormal Pap smear    History of chicken pox    Nasal sore    txed Dr. Jarrett Soho and bactrim help   Uterine fibroid    UTI (urinary tract infection)    Past Surgical History:  Procedure Laterality Date   s/p intervention for uterine fibroids     Family History  Problem Relation Age of Onset   Hypertension Mother    Atrial fibrillation Mother    Lung cancer Father        died age 17   Cancer Maternal Uncle        mouth and throat cancer   Heart disease Other        aunt   CVA Other        aunt   Heart disease Maternal Aunt        s/p CABG   Breast cancer Neg Hx    Social History   Socioeconomic History   Marital status: Married    Spouse name: Not on file   Number of children: Not on file   Years of education: Not on file   Highest education level: Not on file  Occupational History   Not on file  Tobacco Use   Smoking status: Never   Smokeless tobacco: Never  Substance and Sexual Activity   Alcohol use: No    Alcohol/week: 0.0 standard drinks of alcohol   Drug use: No   Sexual activity: Not on file  Other Topics Concern   Not on file  Social History Narrative   Not on file   Social Determinants of Health   Financial Resource Strain: Low Risk  (11/18/2021)   Overall Financial Resource Strain (CARDIA)    Difficulty of Paying Living Expenses: Not hard at all  Food Insecurity: No Food Insecurity (11/18/2021)    Hunger Vital Sign    Worried About Running Out of Food in the Last Year: Never true    Ran Out of Food in the Last Year: Never true  Transportation Needs: No Transportation Needs (11/18/2021)   PRAPARE - Hydrologist (Medical): No    Lack of Transportation (Non-Medical): No  Physical Activity: Sufficiently Active (11/18/2021)   Exercise Vital Sign    Days of Exercise per Week: 5 days    Minutes of Exercise per Session: 30 min  Stress: No Stress Concern Present (11/18/2021)   [place]    Feeling of Stress : Not at all  Social Connections: Unknown (11/18/2021)   Social Connection and Isolation Panel [NHANES]    Frequency of Communication with Friends and Family: More than three times a week    Frequency of Social Gatherings with Friends and Family: More than three times a week    Attends Religious Services:  More than 4 times per year    Active Member of Clubs or Organizations: Yes    Attends Archivist Meetings: More than 4 times per year    Marital Status: Not on file     Review of Systems  Constitutional:  Negative for appetite change and unexpected weight change.  HENT:  Negative for congestion and sinus pressure.   Respiratory:  Negative for cough, chest tightness and shortness of breath.   Cardiovascular:  Negative for chest pain and palpitations.  Gastrointestinal:  Negative for diarrhea, nausea and vomiting.       Pain as outlined.    Genitourinary:  Negative for difficulty urinating, dysuria and hematuria.  Musculoskeletal:  Negative for joint swelling and myalgias.  Skin:  Negative for color change and rash.  Neurological:  Negative for dizziness and headaches.  Psychiatric/Behavioral:  Negative for agitation and dysphoric mood.        Objective:     BP 128/70   Pulse 80   Temp 98.3 F (36.8 C)   Resp 16   Ht 5\' 8"  (1.727 m)   Wt 127 lb (57.6 kg)   SpO2 98%    BMI 19.31 kg/m  Wt Readings from Last 3 Encounters:  07/31/22 127 lb (57.6 kg)  02/17/22 128 lb (58.1 kg)  01/26/22 127 lb 12.8 oz (58 kg)    Physical Exam Vitals reviewed.  Constitutional:      General: She is not in acute distress.    Appearance: Normal appearance.  HENT:     Head: Normocephalic and atraumatic.     Right Ear: External ear normal.     Left Ear: External ear normal.  Eyes:     General: No scleral icterus.       Right eye: No discharge.        Left eye: No discharge.     Conjunctiva/sclera: Conjunctivae normal.  Neck:     Thyroid: No thyromegaly.  Cardiovascular:     Rate and Rhythm: Normal rate and regular rhythm.  Pulmonary:     Effort: No respiratory distress.     Breath sounds: Normal breath sounds. No wheezing.  Abdominal:     General: Bowel sounds are normal.     Palpations: Abdomen is soft.     Comments: Tenderness - left lower abdomen.    Musculoskeletal:        General: No swelling or tenderness.     Cervical back: Neck supple. No tenderness.  Lymphadenopathy:     Cervical: No cervical adenopathy.  Skin:    Findings: No erythema or rash.  Neurological:     Mental Status: She is alert.  Psychiatric:        Mood and Affect: Mood normal.        Behavior: Behavior normal.      Outpatient Encounter Medications as of 07/31/2022  Medication Sig   fexofenadine (ALLEGRA) 180 MG tablet Take 180 mg by mouth as needed.   ibuprofen (ADVIL,MOTRIN) 200 MG tablet Take 200 mg by mouth every 6 (six) hours as needed for pain.   Multiple Vitamins-Minerals (ALIVE ENERGY 50+ PO) Take by mouth.   mupirocin ointment (BACTROBAN) 2 % Apply to affected area bid   No facility-administered encounter medications on file as of 07/31/2022.     Lab Results  Component Value Date   WBC 6.5 02/15/2022   HGB 12.7 02/15/2022   HCT 37.7 02/15/2022   PLT 304.0 02/15/2022   GLUCOSE 97 02/15/2022   CHOL  220 (H) 02/15/2022   TRIG 57.0 02/15/2022   HDL 91.70 02/15/2022    LDLDIRECT 99.2 12/26/2012   LDLCALC 117 (H) 02/15/2022   ALT 16 02/15/2022   AST 22 02/15/2022   NA 138 02/15/2022   K 4.3 02/15/2022   CL 102 02/15/2022   CREATININE 0.71 02/15/2022   BUN 15 02/15/2022   CO2 29 02/15/2022   TSH 1.84 02/15/2022    MM 3D SCREEN BREAST BILATERAL  Result Date: 03/31/2022 CLINICAL DATA:  Screening. EXAM: DIGITAL SCREENING BILATERAL MAMMOGRAM WITH TOMOSYNTHESIS AND CAD TECHNIQUE: Bilateral screening digital craniocaudal and mediolateral oblique mammograms were obtained. Bilateral screening digital breast tomosynthesis was performed. The images were evaluated with computer-aided detection. COMPARISON:  Previous exam(s). ACR Breast Density Category c: The breast tissue is heterogeneously dense, which may obscure small masses. FINDINGS: There are no findings suspicious for malignancy. IMPRESSION: No mammographic evidence of malignancy. A result letter of this screening mammogram will be mailed directly to the patient. RECOMMENDATION: Screening mammogram in one year. (Code:SM-B-01Y) BI-RADS CATEGORY  1: Negative. Electronically Signed   By: Ammie Ferrier M.D.   On: 03/31/2022 10:52       Assessment & Plan:  Left lower quadrant abdominal pain Assessment & Plan: Localized left lower abdomen.  Exam as outlined.  Will check cbc, met b and urinalysis.  Given persistence and examination, check pelvic ultrasound.  Further w/up pending results.   Orders: -     CBC with Differential/Platelet -     Basic metabolic panel -     Urinalysis, Routine w reflex microscopic -     US PELVIC COMPLETE WITH TRANSVAGINAL; Future     Einar Pheasant, MD

## 2022-08-01 ENCOUNTER — Telehealth: Payer: Self-pay | Admitting: Internal Medicine

## 2022-08-01 ENCOUNTER — Encounter: Payer: Self-pay | Admitting: Internal Medicine

## 2022-08-01 LAB — URINALYSIS, ROUTINE W REFLEX MICROSCOPIC
Bilirubin Urine: NEGATIVE
Ketones, ur: NEGATIVE
Leukocytes,Ua: NEGATIVE
Nitrite: NEGATIVE
Specific Gravity, Urine: 1.025 (ref 1.000–1.030)
Total Protein, Urine: NEGATIVE
Urine Glucose: NEGATIVE
Urobilinogen, UA: 0.2 (ref 0.0–1.0)
pH: 5.5 (ref 5.0–8.0)

## 2022-08-01 LAB — CBC WITH DIFFERENTIAL/PLATELET
Basophils Absolute: 0.1 10*3/uL (ref 0.0–0.1)
Basophils Relative: 0.9 % (ref 0.0–3.0)
Eosinophils Absolute: 0.1 10*3/uL (ref 0.0–0.7)
Eosinophils Relative: 1.5 % (ref 0.0–5.0)
HCT: 39.7 % (ref 36.0–46.0)
Hemoglobin: 13.3 g/dL (ref 12.0–15.0)
Lymphocytes Relative: 33.4 % (ref 12.0–46.0)
Lymphs Abs: 2.8 10*3/uL (ref 0.7–4.0)
MCHC: 33.5 g/dL (ref 30.0–36.0)
MCV: 92.5 fl (ref 78.0–100.0)
Monocytes Absolute: 0.6 10*3/uL (ref 0.1–1.0)
Monocytes Relative: 7.7 % (ref 3.0–12.0)
Neutro Abs: 4.7 10*3/uL (ref 1.4–7.7)
Neutrophils Relative %: 56.5 % (ref 43.0–77.0)
Platelets: 319 10*3/uL (ref 150.0–400.0)
RBC: 4.29 Mil/uL (ref 3.87–5.11)
RDW: 13.3 % (ref 11.5–15.5)
WBC: 8.4 10*3/uL (ref 4.0–10.5)

## 2022-08-01 LAB — BASIC METABOLIC PANEL
BUN: 13 mg/dL (ref 6–23)
CO2: 30 mEq/L (ref 19–32)
Calcium: 10.3 mg/dL (ref 8.4–10.5)
Chloride: 101 mEq/L (ref 96–112)
Creatinine, Ser: 0.84 mg/dL (ref 0.40–1.20)
GFR: 70.93 mL/min (ref >60.00)
Glucose, Bld: 95 mg/dL (ref 70–99)
Potassium: 4.2 mEq/L (ref 3.5–5.1)
Sodium: 137 mEq/L (ref 135–145)

## 2022-08-01 NOTE — Addendum Note (Signed)
Addended by: Alisa Graff on: 08/01/2022 05:50 AM   Modules accepted: Orders

## 2022-08-01 NOTE — Telephone Encounter (Signed)
Lft pt vm with appt of 08/08/22 arriving at 3 at [employer] with full bladder and to call [phone] option 4 then 2 if need to resch. thanks

## 2022-08-01 NOTE — Addendum Note (Signed)
Addended by: Alisa Graff on: 08/01/2022 05:49 AM   Modules accepted: Orders

## 2022-08-01 NOTE — Assessment & Plan Note (Signed)
Localized left lower abdomen.  Exam as outlined.  Will check cbc, met b and urinalysis.  Given persistence and examination, check pelvic ultrasound.  Further w/up pending results.

## 2022-08-08 ENCOUNTER — Ambulatory Visit
Admission: RE | Admit: 2022-08-08 | Discharge: 2022-08-08 | Disposition: A | Discharge status: Home / Self Care | Payer: Medicare Other | Source: Ambulatory Visit | Attending: Internal Medicine | Admitting: Internal Medicine

## 2022-08-08 DIAGNOSIS — R1032 Left lower quadrant pain: Secondary | ICD-10-CM

## 2022-08-09 ENCOUNTER — Telehealth: Payer: Self-pay | Admitting: *Deleted

## 2022-08-09 NOTE — Telephone Encounter (Signed)
Patient called and Dr Roby Lofts note was read.

## 2022-08-09 NOTE — Telephone Encounter (Signed)
Warden Fillers, CMA 08/09/2022 12:58 PM EDT Back to Top    Left voicemail to return call   Dale Marshall, MD 08/09/2022  5:20 AM EDT     Notify - pelvic ultrasound reveals no acute abnormality.  She did have fibroids - uterus.  If persistent pain, can refer to gyn for further evaluation.

## 2022-08-09 NOTE — Telephone Encounter (Signed)
Noted  

## 2023-02-19 ENCOUNTER — Other Ambulatory Visit (INDEPENDENT_AMBULATORY_CARE_PROVIDER_SITE_OTHER): Payer: Medicare Other

## 2023-02-19 DIAGNOSIS — Z862 Personal history of diseases of the blood and blood-forming organs and certain disorders involving the immune mechanism: Secondary | ICD-10-CM | POA: Diagnosis not present

## 2023-02-19 DIAGNOSIS — M810 Age-related osteoporosis without current pathological fracture: Secondary | ICD-10-CM | POA: Diagnosis not present

## 2023-02-19 DIAGNOSIS — Z13220 Encounter for screening for lipoid disorders: Secondary | ICD-10-CM

## 2023-02-19 LAB — COMPREHENSIVE METABOLIC PANEL
ALT: 19 U/L (ref 0–35)
AST: 26 U/L (ref 0–37)
Albumin: 4.1 g/dL (ref 3.5–5.2)
Alkaline Phosphatase: 82 U/L (ref 39–117)
BUN: 15 mg/dL (ref 6–23)
CO2: 27 meq/L (ref 19–32)
Calcium: 9.5 mg/dL (ref 8.4–10.5)
Chloride: 104 meq/L (ref 96–112)
Creatinine, Ser: 0.76 mg/dL (ref 0.40–1.20)
GFR: 79.67 mL/min (ref >60.00)
Glucose, Bld: 95 mg/dL (ref 70–99)
Potassium: 3.9 meq/L (ref 3.5–5.1)
Sodium: 141 meq/L (ref 135–145)
Total Bilirubin: 0.4 mg/dL (ref 0.2–1.2)
Total Protein: 6.9 g/dL (ref 6.0–8.3)

## 2023-02-19 LAB — LIPID PANEL
Cholesterol: 220 mg/dL — ABNORMAL HIGH (ref 0–200)
HDL: 90 mg/dL (ref >39.00)
LDL Cholesterol: 115 mg/dL — ABNORMAL HIGH (ref 0–99)
NonHDL: 130.21
Total CHOL/HDL Ratio: 2
Triglycerides: 77 mg/dL (ref 0.0–149.0)
VLDL: 15.4 mg/dL (ref 0.0–40.0)

## 2023-02-19 LAB — CBC WITH DIFFERENTIAL/PLATELET
Basophils Absolute: 0.1 10*3/uL (ref 0.0–0.1)
Basophils Relative: 1.2 % (ref 0.0–3.0)
Eosinophils Absolute: 0.2 10*3/uL (ref 0.0–0.7)
Eosinophils Relative: 2.2 % (ref 0.0–5.0)
HCT: 39.6 % (ref 36.0–46.0)
Hemoglobin: 13 g/dL (ref 12.0–15.0)
Lymphocytes Relative: 31.5 % (ref 12.0–46.0)
Lymphs Abs: 2.2 10*3/uL (ref 0.7–4.0)
MCHC: 32.8 g/dL (ref 30.0–36.0)
MCV: 93.3 fL (ref 78.0–100.0)
Monocytes Absolute: 0.6 10*3/uL (ref 0.1–1.0)
Monocytes Relative: 8.2 % (ref 3.0–12.0)
Neutro Abs: 4 10*3/uL (ref 1.4–7.7)
Neutrophils Relative %: 56.9 % (ref 43.0–77.0)
Platelets: 293 10*3/uL (ref 150.0–400.0)
RBC: 4.24 Mil/uL (ref 3.87–5.11)
RDW: 13.2 % (ref 11.5–15.5)
WBC: 7.1 10*3/uL (ref 4.0–10.5)

## 2023-02-19 LAB — TSH: TSH: 1.47 u[IU]/mL (ref 0.35–5.50)

## 2023-02-22 ENCOUNTER — Ambulatory Visit (INDEPENDENT_AMBULATORY_CARE_PROVIDER_SITE_OTHER): Payer: Medicare Other | Admitting: Internal Medicine

## 2023-02-22 ENCOUNTER — Encounter: Payer: Self-pay | Admitting: Internal Medicine

## 2023-02-22 VITALS — BP 128/74 | HR 87 | Temp 97.9°F | Resp 16 | Ht 67.5 in | Wt 127.8 lb

## 2023-02-22 DIAGNOSIS — M810 Age-related osteoporosis without current pathological fracture: Secondary | ICD-10-CM | POA: Diagnosis not present

## 2023-02-22 DIAGNOSIS — Z0000 Encounter for general adult medical examination without abnormal findings: Secondary | ICD-10-CM

## 2023-02-22 DIAGNOSIS — Z9109 Other allergy status, other than to drugs and biological substances: Secondary | ICD-10-CM | POA: Diagnosis not present

## 2023-02-22 DIAGNOSIS — Z1231 Encounter for screening mammogram for malignant neoplasm of breast: Secondary | ICD-10-CM

## 2023-02-22 MED ORDER — MUPIROCIN 2 % EX OINT: TOPICAL_OINTMENT | CUTANEOUS | 0 refills | Status: AC

## 2023-02-22 NOTE — Assessment & Plan Note (Signed)
Continue calcium, vitamin D and weight bearing exercise.  Has declined rx medication.    Follow.  

## 2023-02-22 NOTE — Assessment & Plan Note (Signed)
Physical today 02/22/23.  Mammogram 07/27/22 - Birads I.  Declined colonoscopy previously.  cologuard 03/2021 - negative.   The 10-year ASCVD risk score (Arnett DK, et al., 2019) is: 7.8%   Values used to calculate the score:     Age: 72 years     Sex: Female     Is Non-Hispanic African American: No     Diabetic: No     Tobacco smoker: No     Systolic Blood Pressure: 128 mmHg     Is BP treated: No     HDL Cholesterol: 90 mg/dL     Total Cholesterol: 220 mg/dL  Follow lipid panel.

## 2023-02-22 NOTE — Progress Notes (Addendum)
 Subjective:    Patient ID: Hannah Hart, female    DOB: 04/15/1951, 72 y.o.   MRN: [id number]  Patient here for  Chief Complaint  Patient presents with   Annual Exam    HPI With past history of osteoporosis.  In today to follow up on this as well as for a complete physical exam. Previous LLQ pain.  Ultrasound - calcified uterine fibroid. She has previously seen gyn and was aware she has fibroids. They evaluated previously.  She denies any pain or problems now. Stays active.  No chest pain or sob reported.  No cough or congestion.  No abdominal pain or bowel change.  Overall doing well.  Seeing dermatology.    Past Medical History:  Diagnosis Date   COVID-19    Environmental allergies    History of abnormal Pap smear    History of chicken pox    Nasal sore    txed Dr. Princess Perna and bactrim help   Uterine fibroid    UTI (urinary tract infection)    Past Surgical History:  Procedure Laterality Date   s/p intervention for uterine fibroids     Family History  Problem Relation Age of Onset   Hypertension Mother    Atrial fibrillation Mother    Lung cancer Father        died age 65   Cancer Maternal Uncle        mouth and throat cancer   Heart disease Other        aunt   CVA Other        aunt   Heart disease Maternal Aunt        s/p CABG   Breast cancer Neg Hx    Social History   Socioeconomic History   Marital status: Married    Spouse name: Not on file   Number of children: Not on file   Years of education: Not on file   Highest education level: Not on file  Occupational History   Not on file  Tobacco Use   Smoking status: Never   Smokeless tobacco: Never  Substance and Sexual Activity   Alcohol use: No    Alcohol/week: 0.0 standard drinks of alcohol   Drug use: No   Sexual activity: Not on file  Other Topics Concern   Not on file  Social History Narrative   Not on file   Social Drivers of Health   Financial Resource Strain: Low Risk   (11/18/2021)   Overall Financial Resource Strain (CARDIA)    Difficulty of Paying Living Expenses: Not hard at all  Food Insecurity: No Food Insecurity (11/18/2021)   Hunger Vital Sign    Worried About Running Out of Food in the Last Year: Never true    Ran Out of Food in the Last Year: Never true  Transportation Needs: No Transportation Needs (11/18/2021)   PRAPARE - Administrator, Civil Service (Medical): No    Lack of Transportation (Non-Medical): No  Physical Activity: Sufficiently Active (11/18/2021)   Exercise Vital Sign    Days of Exercise per Week: 5 days    Minutes of Exercise per Session: 30 min  Stress: No Stress Concern Present (11/18/2021)   [employer] of Occupational Health - Occupational Stress Questionnaire    Feeling of Stress : Not at all  Social Connections: Unknown (11/18/2021)   Social Connection and Isolation Panel [NHANES]    Frequency of Communication with Friends and Family: More than three times  a week    Frequency of Social Gatherings with Friends and Family: More than three times a week    Attends Religious Services: More than 4 times per year    Active Member of Clubs or Organizations: Yes    Attends Engineer, structural: More than 4 times per year    Marital Status: Not on file     Review of Systems  Constitutional:  Negative for appetite change and unexpected weight change.  HENT:  Negative for congestion, sinus pressure and sore throat.   Eyes:  Negative for pain and visual disturbance.  Respiratory:  Negative for cough, chest tightness and shortness of breath.   Cardiovascular:  Negative for chest pain, palpitations and leg swelling.  Gastrointestinal:  Negative for abdominal pain, diarrhea, nausea and vomiting.  Genitourinary:  Negative for difficulty urinating and dysuria.  Musculoskeletal:  Negative for joint swelling and myalgias.  Skin:  Negative for color change and rash.  Neurological:  Negative for dizziness and  headaches.  Hematological:  Negative for adenopathy. Does not bruise/bleed easily.  Psychiatric/Behavioral:  Negative for agitation and dysphoric mood.        Objective:     BP 128/74   Pulse 87   Temp 97.9 F (36.6 C)   Resp 16   Ht 5' 7.5" (1.715 m)   Wt 127 lb 12.8 oz (58 kg)   SpO2 98%   BMI 19.72 kg/m  Wt Readings from Last 3 Encounters:  02/22/23 127 lb 12.8 oz (58 kg)  07/31/22 127 lb (57.6 kg)  02/17/22 128 lb (58.1 kg)    Physical Exam Vitals reviewed.  Constitutional:      General: She is not in acute distress.    Appearance: Normal appearance. She is well-developed.  HENT:     Head: Normocephalic and atraumatic.     Right Ear: External ear normal.     Left Ear: External ear normal.  Eyes:     General: No scleral icterus.       Right eye: No discharge.        Left eye: No discharge.     Conjunctiva/sclera: Conjunctivae normal.  Neck:     Thyroid: No thyromegaly.  Cardiovascular:     Rate and Rhythm: Normal rate and regular rhythm.  Pulmonary:     Effort: No tachypnea, accessory muscle usage or respiratory distress.     Breath sounds: Normal breath sounds. No decreased breath sounds or wheezing.  Chest:  Breasts:    Right: No inverted nipple, mass, nipple discharge or tenderness (no axillary adenopathy).     Left: No inverted nipple, mass, nipple discharge or tenderness (no axilarry adenopathy).  Abdominal:     General: Bowel sounds are normal.     Palpations: Abdomen is soft.     Tenderness: There is no abdominal tenderness.  Musculoskeletal:        General: No swelling or tenderness.     Cervical back: Neck supple.  Lymphadenopathy:     Cervical: No cervical adenopathy.  Skin:    Findings: No erythema or rash.  Neurological:     Mental Status: She is alert and oriented to person, place, and time.  Psychiatric:        Mood and Affect: Mood normal.        Behavior: Behavior normal.      Outpatient Encounter Medications as of 02/22/2023   Medication Sig   fexofenadine (ALLEGRA) 180 MG tablet Take 180 mg by mouth as needed.  ibuprofen (ADVIL,MOTRIN) 200 MG tablet Take 200 mg by mouth every 6 (six) hours as needed for pain.   Multiple Vitamins-Minerals (ALIVE ENERGY 50+ PO) Take by mouth.   mupirocin ointment (BACTROBAN) 2 % Apply to affected area bid   [DISCONTINUED] mupirocin ointment (BACTROBAN) 2 % Apply to affected area bid   No facility-administered encounter medications on file as of 02/22/2023.     Lab Results  Component Value Date   WBC 7.1 02/19/2023   HGB 13.0 02/19/2023   HCT 39.6 02/19/2023   PLT 293.0 02/19/2023   GLUCOSE 95 02/19/2023   CHOL 220 (H) 02/19/2023   TRIG 77.0 02/19/2023   HDL 90.00 02/19/2023   LDLDIRECT 99.2 12/26/2012   LDLCALC 115 (H) 02/19/2023   ALT 19 02/19/2023   AST 26 02/19/2023   NA 141 02/19/2023   K 3.9 02/19/2023   CL 104 02/19/2023   CREATININE 0.76 02/19/2023   BUN 15 02/19/2023   CO2 27 02/19/2023   TSH 1.47 02/19/2023    US Pelvic Complete With Transvaginal  Result Date: 08/08/2022 CLINICAL DATA:  Left lower quadrant abdominal pain EXAM: TRANSABDOMINAL AND TRANSVAGINAL ULTRASOUND OF PELVIS TECHNIQUE: Both transabdominal and transvaginal ultrasound examinations of the pelvis were performed. Transabdominal technique was performed for global imaging of the pelvis including uterus, ovaries, adnexal regions, and pelvic cul-de-sac. It was necessary to proceed with endovaginal exam following the transabdominal exam to visualize the adnexal structures. COMPARISON:  None Available. FINDINGS: Uterus Measurements: 6.0 x 4.0 x 4.4 cm = volume: 55.5 mL. There is an exophytic 2.8 x 2.8 x 3.2 cm partially calcified fibroid. There is a 3.9 x 3.5 x 3.4 cm intramural fibroid uterine fundus. There is an additional subcentimeter probable fibroid within the anterior uterine body. Endometrium Thickness: 1 mm.  Unremarkable. Right ovary Not visualized Left ovary Not visualized Other findings No  abnormal free fluid. IMPRESSION: 1. No acute process within the pelvis. 2. Fibroid uterus. Electronically Signed   By: Annia Belt M.D.   On: 08/08/2022 15:47       Assessment & Plan:  Routine general medical examination at a health care facility  Health care maintenance Assessment & Plan: Physical today 02/22/23.  Mammogram 07/27/22 - Birads I.  Declined colonoscopy previously.  cologuard 03/2021 - negative.   The 10-year ASCVD risk score (Arnett DK, et al., 2019) is: 7.8%   Values used to calculate the score:     Age: 56 years     Sex: Female     Is Non-Hispanic African American: No     Diabetic: No     Tobacco smoker: No     Systolic Blood Pressure: 128 mmHg     Is BP treated: No     HDL Cholesterol: 90 mg/dL     Total Cholesterol: 220 mg/dL  Follow lipid panel.    Visit for screening mammogram -     3D Screening Mammogram, Left and Right; Future  Osteoporosis without current pathological fracture, unspecified osteoporosis type Assessment & Plan: Continue calcium, vitamin D and weight bearing exercise.  Has declined rx medication.    Follow.    Environmental allergies Assessment & Plan: Controlled.    Other orders -     Mupirocin; Apply to affected area bid  Dispense: 22 g; Refill: 0     Dale Obert, MD

## 2023-02-22 NOTE — Assessment & Plan Note (Signed)
Controlled.  

## 2023-03-12 ENCOUNTER — Telehealth: Payer: Self-pay | Admitting: Internal Medicine

## 2023-03-12 NOTE — Telephone Encounter (Signed)
Copied from CRM [id number]. Topic: Medicare AWV >> Mar 12, 2023 10:08 AM Payton Doughty wrote: Reason for CRM: Called LVM 03/12/2023 to schedule Annual Wellness Visit  Verlee Rossetti; Care Guide Ambulatory Clinical Support [place] [facility] Direct Dial: [phone]

## 2023-04-05 ENCOUNTER — Ambulatory Visit
Admission: RE | Admit: 2023-04-05 | Discharge: 2023-04-05 | Disposition: A | Discharge status: Home / Self Care | Payer: Medicare Other | Source: Ambulatory Visit | Attending: Internal Medicine | Admitting: Internal Medicine

## 2023-04-05 DIAGNOSIS — Z1231 Encounter for screening mammogram for malignant neoplasm of breast: Secondary | ICD-10-CM | POA: Insufficient documentation

## 2023-05-14 ENCOUNTER — Telehealth: Payer: Self-pay | Admitting: Internal Medicine

## 2023-05-14 NOTE — Telephone Encounter (Signed)
 Copied from CRM [id number]. Topic: Medicare AWV >> May 14, 2023  9:58 AM Nathanel DEL wrote: Reason for CRM: Called LVM 05/14/2023 to schedule AWV. Please schedule office or virtual visits  Nathanel Paschal; Care Guide Ambulatory Clinical Support [place] [facility] Direct Dial: [phone]

## 2023-06-19 ENCOUNTER — Telehealth: Payer: Self-pay | Admitting: Internal Medicine

## 2023-06-19 NOTE — Telephone Encounter (Signed)
Copied from CRM [id number]. Topic: Medicare AWV >> Jun 19, 2023 10:04 AM Payton Doughty wrote: Reason for CRM: Called LVM 06/19/2023 to schedule AWV. Please schedule office or virtual visits.  Verlee Rossetti; Care Guide Ambulatory Clinical Support [place] [facility] Direct Dial: [phone]

## 2023-06-28 ENCOUNTER — Telehealth: Payer: Self-pay | Admitting: Internal Medicine

## 2023-06-28 NOTE — Telephone Encounter (Signed)
 Patient called stated she came in for her yearly follow-up on 02/22/23 and was billed 325.00. She called billing to send up for review, however they have not resolved that situation. She spoke with Carina. The patient should not have had a physical on that date of service she has Medicare and a Medicare supplement.

## 2023-06-29 NOTE — Addendum Note (Signed)
 Addended by: Charm Barges on: 06/29/2023 02:20 PM   Modules accepted: Level of Service

## 2024-02-15 ENCOUNTER — Other Ambulatory Visit: Payer: Self-pay | Admitting: *Deleted

## 2024-02-15 DIAGNOSIS — M810 Age-related osteoporosis without current pathological fracture: Secondary | ICD-10-CM

## 2024-02-15 DIAGNOSIS — Z13220 Encounter for screening for lipoid disorders: Secondary | ICD-10-CM

## 2024-02-15 DIAGNOSIS — Z862 Personal history of diseases of the blood and blood-forming organs and certain disorders involving the immune mechanism: Secondary | ICD-10-CM

## 2024-02-16 ENCOUNTER — Other Ambulatory Visit: Payer: Self-pay | Admitting: Internal Medicine

## 2024-02-16 DIAGNOSIS — M810 Age-related osteoporosis without current pathological fracture: Secondary | ICD-10-CM

## 2024-02-16 NOTE — Progress Notes (Signed)
Order placed for future lab

## 2024-02-25 ENCOUNTER — Other Ambulatory Visit: Payer: Medicare Other

## 2024-02-25 DIAGNOSIS — M810 Age-related osteoporosis without current pathological fracture: Secondary | ICD-10-CM | POA: Diagnosis not present

## 2024-02-25 DIAGNOSIS — Z862 Personal history of diseases of the blood and blood-forming organs and certain disorders involving the immune mechanism: Secondary | ICD-10-CM | POA: Diagnosis not present

## 2024-02-25 DIAGNOSIS — Z13220 Encounter for screening for lipoid disorders: Secondary | ICD-10-CM

## 2024-02-25 LAB — LIPID PANEL
Cholesterol: 223 mg/dL — ABNORMAL HIGH (ref 0–200)
HDL: 89.7 mg/dL
LDL Cholesterol: 116 mg/dL — ABNORMAL HIGH (ref 0–99)
NonHDL: 133.4
Total CHOL/HDL Ratio: 2
Triglycerides: 86 mg/dL (ref 0.0–149.0)
VLDL: 17.2 mg/dL (ref 0.0–40.0)

## 2024-02-25 LAB — COMPREHENSIVE METABOLIC PANEL WITH GFR
ALT: 12 U/L (ref 0–35)
AST: 19 U/L (ref 0–37)
Albumin: 4.2 g/dL (ref 3.5–5.2)
Alkaline Phosphatase: 79 U/L (ref 39–117)
BUN: 12 mg/dL (ref 6–23)
CO2: 28 meq/L (ref 19–32)
Calcium: 9.8 mg/dL (ref 8.4–10.5)
Chloride: 102 meq/L (ref 96–112)
Creatinine, Ser: 0.71 mg/dL (ref 0.40–1.20)
GFR: 85.84 mL/min (ref >60.00)
Glucose, Bld: 97 mg/dL (ref 70–99)
Potassium: 4 meq/L (ref 3.5–5.1)
Sodium: 137 meq/L (ref 135–145)
Total Bilirubin: 0.5 mg/dL (ref 0.2–1.2)
Total Protein: 7.1 g/dL (ref 6.0–8.3)

## 2024-02-25 LAB — VITAMIN D 25 HYDROXY (VIT D DEFICIENCY, FRACTURES): VITD: 28.71 ng/mL — ABNORMAL LOW (ref 30.00–100.00)

## 2024-02-25 LAB — CBC WITH DIFFERENTIAL/PLATELET
Basophils Absolute: 0.1 10*3/uL (ref 0.0–0.1)
Basophils Relative: 1.3 % (ref 0.0–3.0)
Eosinophils Absolute: 0.1 10*3/uL (ref 0.0–0.7)
Eosinophils Relative: 1.8 % (ref 0.0–5.0)
HCT: 39.5 % (ref 36.0–46.0)
Hemoglobin: 12.9 g/dL (ref 12.0–15.0)
Lymphocytes Relative: 25.3 % (ref 12.0–46.0)
Lymphs Abs: 1.7 10*3/uL (ref 0.7–4.0)
MCHC: 32.7 g/dL (ref 30.0–36.0)
MCV: 93 fl (ref 78.0–100.0)
Monocytes Absolute: 0.5 10*3/uL (ref 0.1–1.0)
Monocytes Relative: 7.1 % (ref 3.0–12.0)
Neutro Abs: 4.3 10*3/uL (ref 1.4–7.7)
Neutrophils Relative %: 64.5 % (ref 43.0–77.0)
Platelets: 277 10*3/uL (ref 150.0–400.0)
RBC: 4.24 Mil/uL (ref 3.87–5.11)
RDW: 12.9 % (ref 11.5–15.5)
WBC: 6.7 10*3/uL (ref 4.0–10.5)

## 2024-02-25 LAB — TSH: TSH: 1.84 u[IU]/mL (ref 0.35–5.50)

## 2024-02-26 ENCOUNTER — Ambulatory Visit: Payer: Self-pay | Admitting: Internal Medicine

## 2024-02-28 ENCOUNTER — Ambulatory Visit: Payer: Medicare Other | Admitting: Internal Medicine

## 2024-02-28 ENCOUNTER — Ambulatory Visit: Attending: Internal Medicine

## 2024-02-28 ENCOUNTER — Encounter: Payer: Self-pay | Admitting: Internal Medicine

## 2024-02-28 VITALS — BP 122/76 | HR 81 | Temp 98.1°F | Ht 67.5 in | Wt 125.0 lb

## 2024-02-28 DIAGNOSIS — R002 Palpitations: Secondary | ICD-10-CM | POA: Diagnosis not present

## 2024-02-28 DIAGNOSIS — Z13220 Encounter for screening for lipoid disorders: Secondary | ICD-10-CM | POA: Diagnosis not present

## 2024-02-28 DIAGNOSIS — M810 Age-related osteoporosis without current pathological fracture: Secondary | ICD-10-CM | POA: Diagnosis not present

## 2024-02-28 DIAGNOSIS — Z1231 Encounter for screening mammogram for malignant neoplasm of breast: Secondary | ICD-10-CM

## 2024-02-28 DIAGNOSIS — Z0000 Encounter for general adult medical examination without abnormal findings: Secondary | ICD-10-CM | POA: Diagnosis not present

## 2024-02-28 DIAGNOSIS — Z862 Personal history of diseases of the blood and blood-forming organs and certain disorders involving the immune mechanism: Secondary | ICD-10-CM | POA: Diagnosis not present

## 2024-02-28 DIAGNOSIS — Z136 Encounter for screening for cardiovascular disorders: Secondary | ICD-10-CM

## 2024-02-28 NOTE — Assessment & Plan Note (Addendum)
 Physical today 02/28/24.  Mammogram 04/15/23 - Birads I.  Declined colonoscopy previously.  cologuard 03/2021 - negative.  Due cologuard.  Follow lipid panel.  The 10-year ASCVD risk score (Arnett DK, et al., 2019) is: 8.2%   Values used to calculate the score:     Age: 73 years     Clincally relevant sex: Female     Is Non-Hispanic African American: No     Diabetic: No     Tobacco smoker: No     Systolic Blood Pressure: 122 mmHg     Is BP treated: No     HDL Cholesterol: 89.7 mg/dL     Total Cholesterol: 223 mg/dL

## 2024-02-28 NOTE — Progress Notes (Addendum)
 Subjective:    Patient ID: Hannah Hart, female    DOB: [birth year], 73 y.o.   MRN: [id number]  Patient here for  Chief Complaint  Patient presents with   Medical Management of Chronic Issues   Annual Exam    HPI With past history of osteoporosis - comes in today to follow up on this as well as for a complete physical exam. She is doing well. Feels good. Stay active. Reports noticing episodes of increased palpitations. Noticed in 11/2023 - had increased caffeine. Worked outside. Was later in the day. Lasted approximately 30 minutes. Had another episode approximately one month ago. Is very active. No chest pain or sob with increased activity or exertion. No acid reflux. No abdominal pain. Discussed decreased caffeine intake.   Past Medical History:  Diagnosis Date   COVID-19    Environmental allergies    History of abnormal Pap smear    History of chicken pox    Nasal sore    txed Dr. Cheri bactroban  and bactrim help   Uterine fibroid    UTI (urinary tract infection)    Past Surgical History:  Procedure Laterality Date   s/p intervention for uterine fibroids     Family History  Problem Relation Age of Onset   Hypertension Mother    Atrial fibrillation Mother    Lung cancer Father        died age 21   Cancer Maternal Uncle        mouth and throat cancer   Heart disease Other        aunt   CVA Other        aunt   Heart disease Maternal Aunt        s/p CABG   Breast cancer Neg Hx    Social History   Socioeconomic History   Marital status: Married    Spouse name: Not on file   Number of children: Not on file   Years of education: Not on file   Highest education level: Not on file  Occupational History   Not on file  Tobacco Use   Smoking status: Never   Smokeless tobacco: Never  Substance and Sexual Activity   Alcohol use: No    Alcohol/week: 0.0 standard drinks of alcohol   Drug use: No   Sexual activity: Not on file  Other Topics Concern   Not on file   Social History Narrative   Not on file   Social Drivers of Health   Financial Resource Strain: Low Risk  (11/18/2021)   Overall Financial Resource Strain (CARDIA)    Difficulty of Paying Living Expenses: Not hard at all  Food Insecurity: No Food Insecurity (11/18/2021)   Hunger Vital Sign    Worried About Running Out of Food in the Last Year: Never true    Ran Out of Food in the Last Year: Never true  Transportation Needs: No Transportation Needs (11/18/2021)   PRAPARE - Administrator, Civil Service (Medical): No    Lack of Transportation (Non-Medical): No  Physical Activity: Sufficiently Active (11/18/2021)   Exercise Vital Sign    Days of Exercise per Week: 5 days    Minutes of Exercise per Session: 30 min  Stress: No Stress Concern Present (11/18/2021)   [employer] of Occupational Health - Occupational Stress Questionnaire    Feeling of Stress : Not at all  Social Connections: Unknown (11/18/2021)   Social Connection and Isolation Panel    Frequency of  Communication with Friends and Family: More than three times a week    Frequency of Social Gatherings with Friends and Family: More than three times a week    Attends Religious Services: More than 4 times per year    Active Member of [employer] or Organizations: Yes    Attends Engineer, Structural: More than 4 times per year    Marital Status: Not on file     Review of Systems  Constitutional:  Negative for appetite change and unexpected weight change.  HENT:  Negative for congestion, sinus pressure and sore throat.   Eyes:  Negative for pain and visual disturbance.  Respiratory:  Negative for cough, chest tightness and shortness of breath.   Cardiovascular:  Positive for palpitations. Negative for chest pain and leg swelling.  Gastrointestinal:  Negative for abdominal pain, diarrhea, nausea and vomiting.  Genitourinary:  Negative for difficulty urinating and dysuria.  Musculoskeletal:  Negative for joint  swelling and myalgias.  Skin:  Negative for color change and rash.  Neurological:  Negative for dizziness and headaches.  Hematological:  Negative for adenopathy. Does not bruise/bleed easily.  Psychiatric/Behavioral:  Negative for agitation and dysphoric mood.        Objective:     BP 122/76   Pulse 81   Temp 98.1 F (36.7 C) (Oral)   Ht 5' 7.5 (1.715 m)   Wt 125 lb (56.7 kg)   SpO2 99%   BMI 19.29 kg/m  Wt Readings from Last 3 Encounters:  02/28/24 125 lb (56.7 kg)  02/22/23 127 lb 12.8 oz (58 kg)  07/31/22 127 lb (57.6 kg)    Physical Exam Vitals reviewed.  Constitutional:      General: She is not in acute distress.    Appearance: Normal appearance. She is well-developed.  HENT:     Head: Normocephalic and atraumatic.     Right Ear: External ear normal.     Left Ear: External ear normal.     Mouth/Throat:     Pharynx: No oropharyngeal exudate or posterior oropharyngeal erythema.  Eyes:     General: No scleral icterus.       Right eye: No discharge.        Left eye: No discharge.     Conjunctiva/sclera: Conjunctivae normal.  Neck:     Thyroid : No thyromegaly.  Cardiovascular:     Rate and Rhythm: Normal rate and regular rhythm.  Pulmonary:     Effort: No tachypnea, accessory muscle usage or respiratory distress.     Breath sounds: Normal breath sounds. No decreased breath sounds or wheezing.  Chest:  Breasts:    Right: No inverted nipple, mass, nipple discharge or tenderness (no axillary adenopathy).     Left: No inverted nipple, mass, nipple discharge or tenderness (no axilarry adenopathy).  Abdominal:     General: Bowel sounds are normal.     Palpations: Abdomen is soft.     Tenderness: There is no abdominal tenderness.  Musculoskeletal:        General: No swelling or tenderness.     Cervical back: Neck supple.  Lymphadenopathy:     Cervical: No cervical adenopathy.  Skin:    Findings: No erythema or rash.  Neurological:     Mental Status: She is  alert and oriented to person, place, and time.  Psychiatric:        Mood and Affect: Mood normal.        Behavior: Behavior normal.  Outpatient Encounter Medications as of 02/28/2024  Medication Sig   fexofenadine (ALLEGRA) 180 MG tablet Take 180 mg by mouth as needed.   ibuprofen (ADVIL,MOTRIN) 200 MG tablet Take 200 mg by mouth every 6 (six) hours as needed for pain.   Multiple Vitamins-Minerals (ALIVE ENERGY 50+ PO) Take by mouth.   mupirocin  ointment (BACTROBAN ) 2 % Apply to affected area bid   No facility-administered encounter medications on file as of 02/28/2024.     Lab Results  Component Value Date   WBC 6.7 02/25/2024   HGB 12.9 02/25/2024   HCT 39.5 02/25/2024   PLT 277.0 02/25/2024   GLUCOSE 97 02/25/2024   CHOL 223 (H) 02/25/2024   TRIG 86.0 02/25/2024   HDL 89.70 02/25/2024   LDLDIRECT 99.2 12/26/2012   LDLCALC 116 (H) 02/25/2024   ALT 12 02/25/2024   AST 19 02/25/2024   NA 137 02/25/2024   K 4.0 02/25/2024   CL 102 02/25/2024   CREATININE 0.71 02/25/2024   BUN 12 02/25/2024   CO2 28 02/25/2024   TSH 1.84 02/25/2024    MM 3D SCREENING MAMMOGRAM BILATERAL BREAST Result Date: 04/06/2023 CLINICAL DATA:  Screening. EXAM: DIGITAL SCREENING BILATERAL MAMMOGRAM WITH TOMOSYNTHESIS AND CAD TECHNIQUE: Bilateral screening digital craniocaudal and mediolateral oblique mammograms were obtained. Bilateral screening digital breast tomosynthesis was performed. The images were evaluated with computer-aided detection. COMPARISON:  Previous exam(s). ACR Breast Density Category c: The breasts are heterogeneously dense, which may obscure small masses. FINDINGS: There are no findings suspicious for malignancy. IMPRESSION: No mammographic evidence of malignancy. A result letter of this screening mammogram will be mailed directly to the patient. RECOMMENDATION: Screening mammogram in one year. (Code:SM-B-01Y) BI-RADS CATEGORY  1: Negative. Electronically Signed   By: Reyes Phi M.D.   On: 04/06/2023 12:44       Assessment & Plan:  Health care maintenance Assessment & Plan: Physical today 02/28/24.  Mammogram 04/15/23 - Birads I.  Declined colonoscopy previously.  cologuard 03/2021 - negative.  Due cologuard.  Follow lipid panel.  The 10-year ASCVD risk score (Arnett DK, et al., 2019) is: 8.2%   Values used to calculate the score:     Age: 24 years     Clincally relevant sex: Female     Is Non-Hispanic African American: No     Diabetic: No     Tobacco smoker: No     Systolic Blood Pressure: 122 mmHg     Is BP treated: No     HDL Cholesterol: 89.7 mg/dL     Total Cholesterol: 223 mg/dL    History of anemia Assessment & Plan: Check cbc.   Orders: -     CBC with Differential/Platelet; Future -     TSH; Future  Osteoporosis without current pathological fracture, unspecified osteoporosis type Assessment & Plan: Continue calcium, vitamin D  and weight bearing exercise.  Has declined rx medication. Follow.   Orders: -     Comprehensive metabolic panel with GFR; Future  Screening cholesterol level -     Lipid panel; Future  Visit for screening mammogram -     3D Screening Mammogram, Left and Right; Future  Palpitations Assessment & Plan: Reported a some episodes - palpitations as outlined. EKG - SR with no acute ischemic changes. Discussed avoiding increased caffeine. Check labs, including electrolytes, cbc and tsh. Discussed further w/up. Place zio monitor. Follow.   Orders: -     EKG 12-Lead -     LONG TERM MONITOR (3-14 DAYS); Future  Encounter for screening for coronary artery disease Assessment & Plan: Discussed screening CAD. Discussed cholesterol. Check calcium score.   Orders: -     CT CARDIAC SCORING (SELF PAY ONLY); Future     Allena Hamilton, MD

## 2024-02-29 ENCOUNTER — Ambulatory Visit: Payer: Self-pay | Admitting: Internal Medicine

## 2024-03-09 ENCOUNTER — Encounter: Payer: Self-pay | Admitting: Internal Medicine

## 2024-03-09 DIAGNOSIS — Z136 Encounter for screening for cardiovascular disorders: Secondary | ICD-10-CM | POA: Insufficient documentation

## 2024-03-09 NOTE — Assessment & Plan Note (Signed)
 Check cbc

## 2024-03-09 NOTE — Assessment & Plan Note (Signed)
Continue calcium, vitamin D and weight bearing exercise.  Has declined rx medication.    Follow.  

## 2024-03-09 NOTE — Assessment & Plan Note (Signed)
 Discussed screening CAD. Discussed cholesterol. Check calcium score.

## 2024-03-09 NOTE — Assessment & Plan Note (Addendum)
 Reported a some episodes - palpitations as outlined. EKG - SR with no acute ischemic changes. Discussed avoiding increased caffeine. Check labs, including electrolytes, cbc and tsh. Discussed further w/up. Place zio monitor. Follow.

## 2024-03-11 ENCOUNTER — Ambulatory Visit
Admission: RE | Admit: 2024-03-11 | Discharge: 2024-03-11 | Disposition: A | Discharge status: Home / Self Care | Payer: Self-pay | Source: Ambulatory Visit | Attending: Internal Medicine | Admitting: Internal Medicine

## 2024-03-11 DIAGNOSIS — Z136 Encounter for screening for cardiovascular disorders: Secondary | ICD-10-CM | POA: Insufficient documentation

## 2024-03-14 DIAGNOSIS — R002 Palpitations: Secondary | ICD-10-CM

## 2024-03-20 ENCOUNTER — Telehealth: Payer: Self-pay

## 2024-03-20 ENCOUNTER — Other Ambulatory Visit: Payer: Self-pay | Admitting: Internal Medicine

## 2024-03-20 DIAGNOSIS — R918 Other nonspecific abnormal finding of lung field: Secondary | ICD-10-CM

## 2024-03-20 NOTE — Telephone Encounter (Signed)
 Copied from CRM #[id number]. Topic: Clinical - Medical Advice >> Mar 20, 2024  7:42 AM Harlene ORN wrote: Reason for CRM: Please have Dr. Glendia nurse return Marvin's call.

## 2024-03-20 NOTE — Telephone Encounter (Signed)
 Per Dr Glendia: Pt was called and discussed CT results with Ms Mccollister. Discussed lung nodules and follow up. Agreeable for referral to pulmonary.

## 2024-03-20 NOTE — Telephone Encounter (Unsigned)
 Copied from CRM [id number]. Topic: General - Call Back - No Documentation >> Mar 20, 2024  2:18 PM Alfonso ORN wrote: Reason for CRM: pt decided to wait on ct scan , would like to address questions for pcp first

## 2024-03-20 NOTE — Progress Notes (Signed)
 Order placed for pulmonary referral.

## 2024-03-20 NOTE — Telephone Encounter (Signed)
 noted

## 2024-03-22 NOTE — Telephone Encounter (Signed)
 Pt called and notified she wanted to hold on pulmonary referral. Please hold on referral.

## 2024-04-03 ENCOUNTER — Ambulatory Visit: Admitting: Internal Medicine

## 2024-04-03 ENCOUNTER — Encounter: Payer: Self-pay | Admitting: Internal Medicine

## 2024-04-03 VITALS — BP 128/74 | HR 80 | Temp 98.0°F | Ht 67.5 in | Wt 128.8 lb

## 2024-04-03 NOTE — Progress Notes (Addendum)
 Subjective:    Patient ID: Hannah Hart, female    DOB: 1951/03/23, 73 y.o.   MRN: [id number]  Patient here for  Chief Complaint  Patient presents with   Medical Management of Chronic Issues    Check up, go over results     HPI Here for a scheduled follow up. Last visit reported palpitations.  Zio monitor - no sustained arrhythmias. No afib. Coronary calcium score 10. Discussed statin therapy. Small parenchymal nodules in the lower lobes bilaterally. Discussed f/u chest CT to better evaluated nodules. Discussed recommendation for statin therapy. She stays active. No chest pain or sob reported. No abdominal pain or bowel change reported.    Past Medical History:  Diagnosis Date   COVID-19    Environmental allergies    History of abnormal Pap smear    History of chicken pox    Nasal sore    txed Dr. Cheri bactroban  and bactrim help   Uterine fibroid    UTI (urinary tract infection)    Past Surgical History:  Procedure Laterality Date   s/p intervention for uterine fibroids     Family History  Problem Relation Age of Onset   Hypertension Mother    Atrial fibrillation Mother    Lung cancer Father        died age 37   Cancer Maternal Uncle        mouth and throat cancer   Heart disease Other        aunt   CVA Other        aunt   Heart disease Maternal Aunt        s/p CABG   Breast cancer Neg Hx    Social History   Socioeconomic History   Marital status: Married    Spouse name: Not on file   Number of children: Not on file   Years of education: Not on file   Highest education level: Not on file  Occupational History   Not on file  Tobacco Use   Smoking status: Never   Smokeless tobacco: Never  Substance and Sexual Activity   Alcohol use: No    Alcohol/week: 0.0 standard drinks of alcohol   Drug use: No   Sexual activity: Not on file  Other Topics Concern   Not on file  Social History Narrative   Not on file   Social Drivers of Health   Tobacco  Use: Low Risk (04/13/2024)   Patient History    Smoking Tobacco Use: Never    Smokeless Tobacco Use: Never    Passive Exposure: Not on file  Financial Resource Strain: Low Risk (11/18/2021)   Overall Financial Resource Strain (CARDIA)    Difficulty of Paying Living Expenses: Not hard at all  Food Insecurity: No Food Insecurity (11/18/2021)   Hunger Vital Sign    Worried About Running Out of Food in the Last Year: Never true    Ran Out of Food in the Last Year: Never true  Transportation Needs: No Transportation Needs (11/18/2021)   PRAPARE - Administrator, Civil Service (Medical): No    Lack of Transportation (Non-Medical): No  Physical Activity: Sufficiently Active (11/18/2021)   Exercise Vital Sign    Days of Exercise per Week: 5 days    Minutes of Exercise per Session: 30 min  Stress: No Stress Concern Present (11/18/2021)   [employer] of Occupational Health - Occupational Stress Questionnaire    Feeling of Stress : Not at all  Social Connections: Unknown (11/18/2021)   Social Connection and Isolation Panel    Frequency of Communication with Friends and Family: More than three times a week    Frequency of Social Gatherings with Friends and Family: More than three times a week    Attends Religious Services: More than 4 times per year    Active Member of [employer] or Organizations: Yes    Attends Banker Meetings: More than 4 times per year    Marital Status: Not on file  Depression (PHQ2-9): Low Risk (02/28/2024)   Depression (PHQ2-9)    PHQ-2 Score: 0  Alcohol Screen: Not on file  Housing: Low Risk (11/18/2021)   Housing    Last Housing Risk Score: 0  Utilities: Not on file  Health Literacy: Not on file     Review of Systems  Constitutional:  Negative for appetite change and unexpected weight change.  HENT:  Negative for congestion and sinus pressure.   Respiratory:  Negative for cough, chest tightness and shortness of breath.   Cardiovascular:   Negative for chest pain, palpitations and leg swelling.  Gastrointestinal:  Negative for abdominal pain, diarrhea, nausea and vomiting.  Genitourinary:  Negative for difficulty urinating and dysuria.  Musculoskeletal:  Negative for joint swelling and myalgias.  Skin:  Negative for color change and rash.  Neurological:  Negative for dizziness and headaches.  Psychiatric/Behavioral:  Negative for agitation and dysphoric mood.        Objective:     BP 128/74   Pulse 80   Temp 98 F (36.7 C) (Oral)   Ht 5' 7.5 (1.715 m)   Wt 128 lb 12.8 oz (58.4 kg)   SpO2 98%   BMI 19.88 kg/m  Wt Readings from Last 3 Encounters:  04/03/24 128 lb 12.8 oz (58.4 kg)  02/28/24 125 lb (56.7 kg)  02/22/23 127 lb 12.8 oz (58 kg)    Physical Exam Vitals reviewed.  Constitutional:      General: She is not in acute distress.    Appearance: Normal appearance.  HENT:     Head: Normocephalic and atraumatic.     Right Ear: External ear normal.     Left Ear: External ear normal.     Mouth/Throat:     Pharynx: No oropharyngeal exudate or posterior oropharyngeal erythema.  Eyes:     General: No scleral icterus.       Right eye: No discharge.        Left eye: No discharge.     Conjunctiva/sclera: Conjunctivae normal.  Neck:     Thyroid : No thyromegaly.  Cardiovascular:     Rate and Rhythm: Normal rate and regular rhythm.  Pulmonary:     Effort: No respiratory distress.     Breath sounds: Normal breath sounds. No wheezing.  Abdominal:     General: Bowel sounds are normal.     Palpations: Abdomen is soft.     Tenderness: There is no abdominal tenderness.  Musculoskeletal:        General: No swelling or tenderness.     Cervical back: Neck supple. No tenderness.  Lymphadenopathy:     Cervical: No cervical adenopathy.  Skin:    Findings: No erythema or rash.  Neurological:     Mental Status: She is alert.  Psychiatric:        Mood and Affect: Mood normal.        Behavior: Behavior normal.          Outpatient Encounter Medications as  of 04/03/2024  Medication Sig   fexofenadine (ALLEGRA) 180 MG tablet Take 180 mg by mouth as needed.   ibuprofen (ADVIL,MOTRIN) 200 MG tablet Take 200 mg by mouth every 6 (six) hours as needed for pain.   Multiple Vitamins-Minerals (ALIVE ENERGY 50+ PO) Take by mouth.   mupirocin  ointment (BACTROBAN ) 2 % Apply to affected area bid   No facility-administered encounter medications on file as of 04/03/2024.     Lab Results  Component Value Date   WBC 6.7 02/25/2024   HGB 12.9 02/25/2024   HCT 39.5 02/25/2024   PLT 277.0 02/25/2024   GLUCOSE 97 02/25/2024   CHOL 223 (H) 02/25/2024   TRIG 86.0 02/25/2024   HDL 89.70 02/25/2024   LDLDIRECT 99.2 12/26/2012   LDLCALC 116 (H) 02/25/2024   ALT 12 02/25/2024   AST 19 02/25/2024   NA 137 02/25/2024   K 4.0 02/25/2024   CL 102 02/25/2024   CREATININE 0.71 02/25/2024   BUN 12 02/25/2024   CO2 28 02/25/2024   TSH 1.84 02/25/2024    CT CARDIAC SCORING Addendum Date: 03/13/2024 ADDENDUM REPORT: 03/13/2024 23:57 EXAM: OVER-READ INTERPRETATION  CT CHEST The following report is an over-read performed by radiologist Dr. Oneil Devonshire of [facility] on 03/13/2024. This over-read does not include interpretation of cardiac or coronary anatomy or pathology. The coronary calcium score interpretation by the cardiologist is attached. COMPARISON:  None. FINDINGS: Cardiovascular: Atherosclerotic calcifications of the aorta are noted. No aneurysmal dilatation is seen. Mediastinum/Nodes: There are no enlarged lymph nodes within the visualized mediastinum. Lungs/Pleura: There is no pleural effusion. Small right lower lobe nodules are noted measuring less than 3 mm. Similar findings are noted in the left lower lobe. Upper abdomen: No significant findings in the visualized upper abdomen. Musculoskeletal/Chest wall: No chest wall mass or suspicious osseous findings within the visualized chest. IMPRESSION:  Aortic Atherosclerosis (ICD10-I70.0). Small parenchymal nodules in the lower lobes bilaterally. No follow-up needed if patient is low-risk (and has no known or suspected primary neoplasm). Non-contrast chest CT can be considered in 12 months if patient is high-risk. This recommendation follows the consensus statement: Guidelines for Management of Incidental Pulmonary Nodules Detected on CT Images: From the Fleischner Society 2017; Radiology 2017; 284:228-243. Electronically Signed   By: Oneil Devonshire M.D.   On: 03/13/2024 23:57   Result Date: 03/13/2024 CLINICAL DATA:  Risk stratification EXAM: Coronary Calcium Score TECHNIQUE: The patient was scanned on a Siemens Somatom scanner. Axial non-contrast 3 mm slices were carried out through the heart. The data set was analyzed on a dedicated work station and scored using the Agatson method. FINDINGS: Non-cardiac: See separate report from [facility]. Ascending Aorta: Normal size. Mild calcifications of the aortic root. Descending Aorta: Moderate calcifications. Pericardium: Normal Coronary arteries: Normal origin of left and right coronary arteries. Distribution of arterial calcifications if present, as noted below; LM 0 LAD 10 LCx 0 RCA 0 Total 10 IMPRESSION AND RECOMMENDATION: 1. Coronary calcium score of 10. This was 47th percentile for age and sex matched control. 2. Aortic atherosclerosis. CAC 1-99 in LAD. CAC-DRS A1/N1. CAC-DRS A0: CAC score 0: Statin generally not recommended unless other high risk condition (e.g., FH, DM2, tobacco use, etc.) CAC-DRS A1: CAC score 1-99: moderate intensity statin CAC-DRS A2: CAC score 100-299: moderate to high intensity statin + ASA 81mg  CAC-DRS A3: CAC score > 300: high intensity statin + ASA 81mg  Continue heart healthy lifestyle and risk factor modification. Electronically Signed: By: Caron Poser On: 03/11/2024  17:06       Assessment & Plan:  Palpitations Assessment & Plan: Zio monitor - no sustained  arrhythmias. No afib. Doing well.    Elevated coronary artery calcium score Assessment & Plan: Discussed recent CT calcium score. Discussed recommendation for statin therapy. Wants to hold on starting. Continue exercise. Follow.    Lung nodules Assessment & Plan: Small parenchymal nodules in the lower lobes bilaterally. Recommend chest CT to better evaluate. Agreeable after first of the year.   Orders: -     CT CHEST WO CONTRAST; Future     Allena Hamilton, MD

## 2024-04-10 ENCOUNTER — Encounter

## 2024-04-13 ENCOUNTER — Encounter: Payer: Self-pay | Admitting: Internal Medicine

## 2024-04-13 DIAGNOSIS — R918 Other nonspecific abnormal finding of lung field: Secondary | ICD-10-CM | POA: Insufficient documentation

## 2024-04-13 DIAGNOSIS — R931 Abnormal findings on diagnostic imaging of heart and coronary circulation: Secondary | ICD-10-CM | POA: Insufficient documentation

## 2024-04-13 NOTE — Assessment & Plan Note (Signed)
 Zio monitor - no sustained arrhythmias. No afib. Doing well.

## 2024-04-13 NOTE — Assessment & Plan Note (Signed)
 Small parenchymal nodules in the lower lobes bilaterally. Recommend chest CT to better evaluate. Agreeable after first of the year.

## 2024-04-13 NOTE — Assessment & Plan Note (Signed)
 Discussed recent CT calcium score. Discussed recommendation for statin therapy. Wants to hold on starting. Continue exercise. Follow.

## 2024-05-12 ENCOUNTER — Ambulatory Visit
Admission: RE | Admit: 2024-05-12 | Discharge: 2024-05-12 | Disposition: A | Discharge status: Home / Self Care | Source: Ambulatory Visit | Attending: Internal Medicine | Admitting: Internal Medicine

## 2024-05-12 DIAGNOSIS — Z1231 Encounter for screening mammogram for malignant neoplasm of breast: Secondary | ICD-10-CM | POA: Diagnosis present

## 2024-05-15 ENCOUNTER — Ambulatory Visit
Admission: RE | Admit: 2024-05-15 | Discharge: 2024-05-15 | Disposition: A | Discharge status: Home / Self Care | Source: Ambulatory Visit | Attending: Internal Medicine | Admitting: Internal Medicine

## 2024-05-15 DIAGNOSIS — R918 Other nonspecific abnormal finding of lung field: Secondary | ICD-10-CM | POA: Insufficient documentation

## 2024-05-23 ENCOUNTER — Ambulatory Visit: Payer: Self-pay | Admitting: Internal Medicine

## 2025-03-02 ENCOUNTER — Other Ambulatory Visit

## 2025-03-05 ENCOUNTER — Encounter: Admitting: Internal Medicine
